# Patient Record
Sex: Female | Born: 1990 | Race: Black or African American | Hispanic: No | Marital: Single | Smoking: Current some day smoker
Health system: Southern US, Community
[De-identification: ages and names within clinical notes are randomized; demographics above are authoritative.]

## PROBLEM LIST (undated history)

## (undated) DIAGNOSIS — K219 Gastro-esophageal reflux disease without esophagitis: Secondary | ICD-10-CM

## (undated) DIAGNOSIS — J45909 Unspecified asthma, uncomplicated: Secondary | ICD-10-CM

## (undated) HISTORY — PX: MOUTH SURGERY: SHX715

---
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
## 2013-06-13 ENCOUNTER — Emergency Department (HOSPITAL_COMMUNITY)
Admission: EM | Admit: 2013-06-13 | Discharge: 2013-06-13 | Disposition: A | Discharge status: Home / Self Care | Payer: BC Managed Care – PPO | Source: Home / Self Care | Attending: Family Medicine | Admitting: Family Medicine

## 2013-06-13 ENCOUNTER — Encounter (HOSPITAL_COMMUNITY): Payer: Self-pay | Admitting: Emergency Medicine

## 2013-06-13 DIAGNOSIS — J45990 Exercise induced bronchospasm: Secondary | ICD-10-CM

## 2013-06-13 MED ORDER — TRIAMCINOLONE ACETONIDE 40 MG/ML IJ SUSP
40.0000 mg | Freq: Once | INTRAMUSCULAR | Status: AC
  Administered 2013-06-13: 40 mg via INTRAMUSCULAR

## 2013-06-13 MED ORDER — ALBUTEROL SULFATE HFA 108 (90 BASE) MCG/ACT IN AERS: 1.0000 | INHALATION_SPRAY | Freq: Four times a day (QID) | RESPIRATORY_TRACT | Status: DC | PRN

## 2013-06-13 MED ORDER — TRIAMCINOLONE ACETONIDE 40 MG/ML IJ SUSP
INTRAMUSCULAR | Status: AC
  Filled 2013-06-13: qty 1

## 2013-06-13 MED ORDER — MONTELUKAST SODIUM 10 MG PO TABS: 10.0000 mg | ORAL_TABLET | Freq: Every day | ORAL | Status: DC

## 2013-06-13 MED ORDER — METHYLPREDNISOLONE ACETATE 80 MG/ML IJ SUSP
INTRAMUSCULAR | Status: AC
  Filled 2013-06-13: qty 1

## 2013-06-13 MED ORDER — METHYLPREDNISOLONE ACETATE 40 MG/ML IJ SUSP
80.0000 mg | Freq: Once | INTRAMUSCULAR | Status: AC
  Administered 2013-06-13: 80 mg via INTRAMUSCULAR

## 2013-06-13 NOTE — ED Notes (Signed)
C/o 3 duration of ongoing cough; coughs until she throws up;  on amoxicillin from 2nd visit to her MD

## 2013-06-13 NOTE — ED Provider Notes (Signed)
CSN: [id number]     Arrival date & time 06/13/13  1842 History   First MD Initiated Contact with Patient 06/13/13 2005     Chief Complaint  Patient presents with  . Cough   (Consider location/radiation/quality/duration/timing/severity/associated sxs/prior Treatment) Patient is a 22 y.o. female presenting with cough. The history is provided by the patient.  Cough Cough characteristics:  Dry and hacking Severity:  Moderate Onset quality:  Gradual Duration:  3 weeks Timing:  Sporadic Progression:  Unchanged Chronicity:  New Smoker: no   Context: with activity   Ineffective treatments:  Decongestant Associated symptoms: shortness of breath   Associated symptoms: no fever and no wheezing     History reviewed. No pertinent past medical history. History reviewed. No pertinent past surgical history. History reviewed. No pertinent family history. History  Substance Use Topics  . Smoking status: Never Smoker   . Smokeless tobacco: Not on file  . Alcohol Use: No   OB History   Grav Para Term Preterm Abortions TAB SAB Ect Mult Living                 Review of Systems  Constitutional: Negative.  Negative for fever.  HENT: Negative.   Respiratory: Positive for cough and shortness of breath. Negative for wheezing.     Allergies  Review of patient's allergies indicates no known allergies.  Home Medications   Current Outpatient Rx  Name  Route  Sig  Dispense  Refill  . albuterol (PROVENTIL HFA;VENTOLIN HFA) 108 (90 BASE) MCG/ACT inhaler   Inhalation   Inhale 1-2 puffs into the lungs every 6 (six) hours as needed for wheezing or shortness of breath. Before exercise activity   1 Inhaler   1   . montelukast (SINGULAIR) 10 MG tablet   Oral   Take 1 tablet (10 mg total) by mouth at bedtime. For asthma prevention   30 tablet   1    BP 128/89  Pulse 68  Temp(Src) 99 F (37.2 C) (Oral)  Resp 16  SpO2 98%  LMP 06/13/2013 Physical Exam  Nursing note and vitals  reviewed. Constitutional: She is oriented to person, place, and time. She appears well-developed and well-nourished. No distress.  HENT:  Head: Normocephalic.  Right Ear: External ear normal.  Left Ear: External ear normal.  Mouth/Throat: Oropharynx is clear and moist.  Neck: Normal range of motion. Neck supple.  Cardiovascular: Regular rhythm.   Pulmonary/Chest: Effort normal and breath sounds normal. No respiratory distress. She has no wheezes.  Neurological: She is alert and oriented to person, place, and time.  Skin: Skin is warm and dry.    ED Course  Procedures (including critical care time) Labs Review Labs Reviewed - No data to display Imaging Review No results found.  EKG Interpretation    Date/Time:    Ventricular Rate:    PR Interval:    QRS Duration:   QT Interval:    QTC Calculation:   R Axis:     Text Interpretation:              MDM      Linna Hoff, MD 06/13/13 2117

## 2013-11-08 ENCOUNTER — Emergency Department (HOSPITAL_COMMUNITY)
Admission: EM | Admit: 2013-11-08 | Discharge: 2013-11-08 | Disposition: A | Discharge status: Home / Self Care | Payer: BC Managed Care – PPO | Source: Home / Self Care | Attending: Family Medicine | Admitting: Family Medicine

## 2013-11-08 ENCOUNTER — Encounter (HOSPITAL_COMMUNITY): Payer: Self-pay | Admitting: Emergency Medicine

## 2013-11-08 DIAGNOSIS — J302 Other seasonal allergic rhinitis: Secondary | ICD-10-CM

## 2013-11-08 DIAGNOSIS — J309 Allergic rhinitis, unspecified: Secondary | ICD-10-CM

## 2013-11-08 MED ORDER — FLUTICASONE PROPIONATE 50 MCG/ACT NA SUSP
1.0000 | Freq: Two times a day (BID) | NASAL | Status: DC
Start: 2013-11-08 — End: 2015-10-02

## 2013-11-08 MED ORDER — METHYLPREDNISOLONE ACETATE 40 MG/ML IJ SUSP
80.0000 mg | Freq: Once | INTRAMUSCULAR | Status: AC
  Administered 2013-11-08: 80 mg via INTRAMUSCULAR

## 2013-11-08 MED ORDER — HYDROCOD POLST-CHLORPHEN POLST 10-8 MG/5ML PO LQCR: 5.0000 mL | Freq: Two times a day (BID) | ORAL | Status: DC | PRN

## 2013-11-08 MED ORDER — METHYLPREDNISOLONE ACETATE 80 MG/ML IJ SUSP
INTRAMUSCULAR | Status: AC
  Filled 2013-11-08: qty 1

## 2013-11-08 MED ORDER — FEXOFENADINE HCL 180 MG PO TABS
180.0000 mg | ORAL_TABLET | Freq: Every day | ORAL | Status: DC
Start: 2013-11-08 — End: 2015-10-02

## 2013-11-08 NOTE — ED Provider Notes (Signed)
CSN: [id number]     Arrival date & time 11/08/13  0827 History   First MD Initiated Contact with Patient 11/08/13 0914     Chief Complaint  Patient presents with  . URI   (Consider location/radiation/quality/duration/timing/severity/associated sxs/prior Treatment) Patient is a 23 y.o. female presenting with URI. The history is provided by the patient.  URI Presenting symptoms: congestion, cough and rhinorrhea   Presenting symptoms: no fever   Severity:  Moderate Onset quality:  Gradual Duration:  1 week Chronicity:  New Associated symptoms: sneezing   Associated symptoms: no wheezing     History reviewed. No pertinent past medical history. History reviewed. No pertinent past surgical history. History reviewed. No pertinent family history. History  Substance Use Topics  . Smoking status: Never Smoker   . Smokeless tobacco: Not on file  . Alcohol Use: No   OB History   Grav Para Term Preterm Abortions TAB SAB Ect Mult Living                 Review of Systems  Constitutional: Negative.  Negative for fever.  HENT: Positive for congestion, postnasal drip, rhinorrhea and sneezing.   Respiratory: Positive for cough. Negative for wheezing.   Gastrointestinal: Positive for nausea.    Allergies  Review of patient's allergies indicates no known allergies.  Home Medications   Prior to Admission medications   Medication Sig Start Date End Date Taking? Authorizing Provider  albuterol (PROVENTIL HFA;VENTOLIN HFA) 108 (90 BASE) MCG/ACT inhaler Inhale 1-2 puffs into the lungs every 6 (six) hours as needed for wheezing or shortness of breath. Before exercise activity 06/13/13   Linna HoffJames D Tannah Dreyfuss, MD  montelukast (SINGULAIR) 10 MG tablet Take 1 tablet (10 mg total) by mouth at bedtime. For asthma prevention 06/13/13   Linna HoffJames D Chandrea Zellman, MD   BP 127/82  Pulse 71  Temp(Src) 97.7 F (36.5 C) (Oral)  Resp 14  SpO2 100%  LMP 10/19/2013 Physical Exam  Nursing note and vitals  reviewed. Constitutional: She is oriented to person, place, and time. She appears well-developed and well-nourished.  HENT:  Head: Normocephalic.  Right Ear: External ear normal.  Left Ear: External ear normal.  Nose: Mucosal edema and rhinorrhea present.  Mouth/Throat: Oropharynx is clear and moist.  Neck: Normal range of motion. Neck supple.  Pulmonary/Chest: Breath sounds normal.  Lymphadenopathy:    She has no cervical adenopathy.  Neurological: She is alert and oriented to person, place, and time.  Skin: Skin is warm.    ED Course  Procedures (including critical care time) Labs Review Labs Reviewed - No data to display  No results found for this or any previous visit. Imaging Review No results found.   MDM   1. Seasonal allergic rhinitis        Linna HoffJames D Innocence Schlotzhauer, MD 11/08/13 1006

## 2013-11-08 NOTE — ED Notes (Signed)
Pt  Reports   She   Has   Symptoms  Of  Nasal  Congested  Cough  And     Sinus  meds            X  sev  Days  Also  Reports            Stomach  Issues  To  Include       Vomiting  /  Constipation               For  sev  Weeks   She                Reports  A  History  Of       [place]

## 2015-10-02 ENCOUNTER — Inpatient Hospital Stay (HOSPITAL_COMMUNITY): Payer: BLUE CROSS/BLUE SHIELD

## 2015-10-02 ENCOUNTER — Encounter (HOSPITAL_COMMUNITY): Payer: Self-pay

## 2015-10-02 ENCOUNTER — Inpatient Hospital Stay (HOSPITAL_COMMUNITY)
Admission: AD | Admit: 2015-10-02 | Discharge: 2015-10-02 | Disposition: A | Discharge status: Home / Self Care | Payer: BLUE CROSS/BLUE SHIELD | Source: Ambulatory Visit | Attending: Family Medicine | Admitting: Family Medicine

## 2015-10-02 ENCOUNTER — Ambulatory Visit (INDEPENDENT_AMBULATORY_CARE_PROVIDER_SITE_OTHER): Payer: Self-pay | Admitting: General Practice

## 2015-10-02 DIAGNOSIS — O26899 Other specified pregnancy related conditions, unspecified trimester: Secondary | ICD-10-CM

## 2015-10-02 DIAGNOSIS — Z3A01 Less than 8 weeks gestation of pregnancy: Secondary | ICD-10-CM | POA: Insufficient documentation

## 2015-10-02 DIAGNOSIS — K219 Gastro-esophageal reflux disease without esophagitis: Secondary | ICD-10-CM | POA: Insufficient documentation

## 2015-10-02 DIAGNOSIS — Z3201 Encounter for pregnancy test, result positive: Secondary | ICD-10-CM

## 2015-10-02 DIAGNOSIS — O9989 Other specified diseases and conditions complicating pregnancy, childbirth and the puerperium: Secondary | ICD-10-CM | POA: Diagnosis not present

## 2015-10-02 DIAGNOSIS — O26891 Other specified pregnancy related conditions, first trimester: Secondary | ICD-10-CM | POA: Diagnosis not present

## 2015-10-02 DIAGNOSIS — R109 Unspecified abdominal pain: Secondary | ICD-10-CM | POA: Diagnosis not present

## 2015-10-02 DIAGNOSIS — J45909 Unspecified asthma, uncomplicated: Secondary | ICD-10-CM | POA: Diagnosis not present

## 2015-10-02 HISTORY — DX: Gastro-esophageal reflux disease without esophagitis: K21.9

## 2015-10-02 HISTORY — DX: Unspecified asthma, uncomplicated: J45.909

## 2015-10-02 LAB — CBC
HCT: 35.7 % — ABNORMAL LOW (ref 36.0–46.0)
Hemoglobin: 12.5 g/dL (ref 12.0–15.0)
MCH: 30.5 pg (ref 26.0–34.0)
MCHC: 35 g/dL (ref 30.0–36.0)
MCV: 87.1 fL (ref 78.0–100.0)
Platelets: 234 10*3/uL (ref 150–400)
RBC: 4.1 MIL/uL (ref 3.87–5.11)
RDW: 13.5 % (ref 11.5–15.5)
WBC: 5.7 10*3/uL (ref 4.0–10.5)

## 2015-10-02 LAB — WET PREP, GENITAL
Clue Cells Wet Prep HPF POC: NONE SEEN
Sperm: NONE SEEN
Trich, Wet Prep: NONE SEEN
Yeast Wet Prep HPF POC: NONE SEEN

## 2015-10-02 LAB — POCT PREGNANCY, URINE: Preg Test, Ur: POSITIVE — AB

## 2015-10-02 LAB — ABO/RH: ABO/RH(D): O POS

## 2015-10-02 LAB — HCG, QUANTITATIVE, PREGNANCY: hCG, Beta Chain, Quant, S: 169 m[IU]/mL — ABNORMAL HIGH (ref <5)

## 2015-10-02 NOTE — MAU Note (Signed)
Pt sent from clinic for evaluation. Pt had TAB in December and started having abdominal pain over the weekend. Pt had positive pregnancy test in clinic today.
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
## 2015-10-02 NOTE — Progress Notes (Signed)
Patient here for upt. upt +. Patient states she had an abortion 12/20. Patient denies intercourse since then. Patient reports normal period 09/01/15. Patient states she started to cramp on Saturday and pain has worsened since then. Spoke to The PNC FinancialLori Clemmons who recommends patient go to MAU for possible ectopic work up. Informed patient & escorted patient upstairs

## 2015-10-02 NOTE — Discharge Instructions (Signed)

## 2015-10-03 LAB — HIV ANTIBODY (ROUTINE TESTING W REFLEX): HIV Screen 4th Generation wRfx: NONREACTIVE

## 2015-10-03 LAB — GC/CHLAMYDIA PROBE AMP (~~LOC~~) NOT AT ARMC
Chlamydia: NEGATIVE
Neisseria Gonorrhea: NEGATIVE

## 2015-10-04 ENCOUNTER — Encounter: Payer: Self-pay | Admitting: Obstetrics & Gynecology

## 2015-10-04 ENCOUNTER — Encounter: Payer: Self-pay | Admitting: Family Medicine

## 2015-10-04 ENCOUNTER — Encounter: Payer: Self-pay | Admitting: *Deleted

## 2015-10-04 ENCOUNTER — Ambulatory Visit (INDEPENDENT_AMBULATORY_CARE_PROVIDER_SITE_OTHER): Payer: BLUE CROSS/BLUE SHIELD | Admitting: Obstetrics & Gynecology

## 2015-10-04 DIAGNOSIS — R109 Unspecified abdominal pain: Secondary | ICD-10-CM | POA: Diagnosis not present

## 2015-10-04 DIAGNOSIS — O9989 Other specified diseases and conditions complicating pregnancy, childbirth and the puerperium: Secondary | ICD-10-CM | POA: Diagnosis not present

## 2015-10-04 DIAGNOSIS — O3680X0 Pregnancy with inconclusive fetal viability, not applicable or unspecified: Secondary | ICD-10-CM

## 2015-10-04 LAB — HCG, QUANTITATIVE, PREGNANCY: hCG, Beta Chain, Quant, S: 373.6 m[IU]/mL — ABNORMAL HIGH

## 2015-10-04 NOTE — Patient Instructions (Signed)
Contraception Choices Contraception (birth control) is the use of any methods or devices to prevent pregnancy. Below are some methods to help avoid pregnancy. HORMONAL METHODS   Contraceptive implant. This is a thin, plastic tube containing progesterone hormone. It does not contain estrogen hormone. Your health care provider inserts the tube in the inner part of the upper arm. The tube can remain in place for up to 3 years. After 3 years, the implant must be removed. The implant prevents the ovaries from releasing an egg (ovulation), thickens the cervical mucus to prevent sperm from entering the uterus, and thins the lining of the inside of the uterus.  Progesterone-only injections. These injections are given every 3 months by your health care provider to prevent pregnancy. This synthetic progesterone hormone stops the ovaries from releasing eggs. It also thickens cervical mucus and changes the uterine lining. This makes it harder for sperm to survive in the uterus.  Birth control pills. These pills contain estrogen and progesterone hormone. They work by preventing the ovaries from releasing eggs (ovulation). They also cause the cervical mucus to thicken, preventing the sperm from entering the uterus. Birth control pills are prescribed by a health care provider.Birth control pills can also be used to treat heavy periods.  Minipill. This type of birth control pill contains only the progesterone hormone. They are taken every day of each month and must be prescribed by your health care provider.  Birth control patch. The patch contains hormones similar to those in birth control pills. It must be changed once a week and is prescribed by a health care provider.  Vaginal ring. The ring contains hormones similar to those in birth control pills. It is left in the vagina for 3 weeks, removed for 1 week, and then a new one is put back in place. The patient must be comfortable inserting and removing the ring  from the vagina.A health care provider's prescription is necessary.  Emergency contraception. Emergency contraceptives prevent pregnancy after unprotected sexual intercourse. This pill can be taken right after sex or up to 5 days after unprotected sex. It is most effective the sooner you take the pills after having sexual intercourse. Most emergency contraceptive pills are available without a prescription. Check with your pharmacist. Do not use emergency contraception as your only form of birth control. BARRIER METHODS   Female condom. This is a thin sheath (latex or rubber) that is worn over the penis during sexual intercourse. It can be used with spermicide to increase effectiveness.  Female condom. This is a soft, loose-fitting sheath that is put into the vagina before sexual intercourse.  Diaphragm. This is a soft, latex, dome-shaped barrier that must be fitted by a health care provider. It is inserted into the vagina, along with a spermicidal jelly. It is inserted before intercourse. The diaphragm should be left in the vagina for 6 to 8 hours after intercourse.  Cervical cap. This is a round, soft, latex or plastic cup that fits over the cervix and must be fitted by a health care provider. The cap can be left in place for up to 48 hours after intercourse.  Sponge. This is a soft, circular piece of polyurethane foam. The sponge has spermicide in it. It is inserted into the vagina after wetting it and before sexual intercourse.  Spermicides. These are chemicals that kill or block sperm from entering the cervix and uterus. They come in the form of creams, jellies, suppositories, foam, or tablets. They do not require a   prescription. They are inserted into the vagina with an applicator before having sexual intercourse. The process must be repeated every time you have sexual intercourse. INTRAUTERINE CONTRACEPTION  Intrauterine device (IUD). This is a T-shaped device that is put in a woman's uterus  during a menstrual period to prevent pregnancy. There are 2 types:  Copper IUD. This type of IUD is wrapped in copper wire and is placed inside the uterus. Copper makes the uterus and fallopian tubes produce a fluid that kills sperm. It can stay in place for 10 years.  Hormone IUD. This type of IUD contains the hormone progestin (synthetic progesterone). The hormone thickens the cervical mucus and prevents sperm from entering the uterus, and it also thins the uterine lining to prevent implantation of a fertilized egg. The hormone can weaken or kill the sperm that get into the uterus. It can stay in place for 3-5 years, depending on which type of IUD is used. PERMANENT METHODS OF CONTRACEPTION  Female tubal ligation. This is when the woman's fallopian tubes are surgically sealed, tied, or blocked to prevent the egg from traveling to the uterus.  Hysteroscopic sterilization. This involves placing a small coil or insert into each fallopian tube. Your doctor uses a technique called hysteroscopy to do the procedure. The device causes scar tissue to form. This results in permanent blockage of the fallopian tubes, so the sperm cannot fertilize the egg. It takes about 3 months after the procedure for the tubes to become blocked. You must use another form of birth control for these 3 months.  Female sterilization. This is when the female has the tubes that carry sperm tied off (vasectomy).This blocks sperm from entering the vagina during sexual intercourse. After the procedure, the man can still ejaculate fluid (semen). NATURAL PLANNING METHODS  Natural family planning. This is not having sexual intercourse or using a barrier method (condom, diaphragm, cervical cap) on days the woman could become pregnant.  Calendar method. This is keeping track of the length of each menstrual cycle and identifying when you are fertile.  Ovulation method. This is avoiding sexual intercourse during ovulation.  Symptothermal  method. This is avoiding sexual intercourse during ovulation, using a thermometer and ovulation symptoms.  Post-ovulation method. This is timing sexual intercourse after you have ovulated. Regardless of which type or method of contraception you choose, it is important that you use condoms to protect against the transmission of sexually transmitted infections (STIs). Talk with your health care provider about which form of contraception is most appropriate for you.   This information is not intended to replace advice given to you by your health care provider. Make sure you discuss any questions you have with your health care provider.   Document Released: 07/14/2005 Document Revised: 07/19/2013 Document Reviewed: 01/06/2013 [employer] Interactive Patient Education 2016 [employer]  
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
## 2015-10-18 ENCOUNTER — Ambulatory Visit (INDEPENDENT_AMBULATORY_CARE_PROVIDER_SITE_OTHER): Payer: BLUE CROSS/BLUE SHIELD | Admitting: Obstetrics & Gynecology

## 2015-10-18 ENCOUNTER — Other Ambulatory Visit: Payer: Self-pay

## 2015-10-18 ENCOUNTER — Encounter: Payer: Self-pay | Admitting: Obstetrics & Gynecology

## 2015-10-18 ENCOUNTER — Ambulatory Visit (HOSPITAL_COMMUNITY)
Admission: RE | Admit: 2015-10-18 | Discharge: 2015-10-18 | Disposition: A | Discharge status: Home / Self Care | Payer: BLUE CROSS/BLUE SHIELD | Source: Ambulatory Visit | Attending: Obstetrics and Gynecology | Admitting: Obstetrics and Gynecology

## 2015-10-18 DIAGNOSIS — Z3490 Encounter for supervision of normal pregnancy, unspecified, unspecified trimester: Secondary | ICD-10-CM | POA: Insufficient documentation

## 2015-10-18 DIAGNOSIS — O200 Threatened abortion: Secondary | ICD-10-CM | POA: Insufficient documentation

## 2015-10-18 DIAGNOSIS — Z3491 Encounter for supervision of normal pregnancy, unspecified, first trimester: Secondary | ICD-10-CM

## 2015-10-18 DIAGNOSIS — Z3A01 Less than 8 weeks gestation of pregnancy: Secondary | ICD-10-CM | POA: Diagnosis not present

## 2015-10-18 DIAGNOSIS — O0281 Inappropriate change in quantitative human chorionic gonadotropin (hCG) in early pregnancy: Secondary | ICD-10-CM

## 2015-10-18 HISTORY — DX: Encounter for supervision of normal pregnancy, unspecified, unspecified trimester: Z34.90

## 2015-10-18 NOTE — Progress Notes (Signed)
Called pt and advised her of next appt for US on 4/6 @ 0800. She will be seen @ the clinic after the US to receive results.  Pt voiced understanding.

## 2015-10-18 NOTE — Progress Notes (Signed)
Ultrasounds Results Note  SUBJECTIVE HPI:  Ms. Terry [name] is a 25 y.o. G2P0010 at 2065w5d by LMP who presents to the [facility] for followup ultrasound results. The patient denies abdominal pain or vaginal bleeding.  Upon review of the patient's records, patient was first seen in [facility] on 10/02/15 for cramping.  BHCG on that day was 169, follow up HCG was 373 2 days later.  Ultrasound showed nothing in uterus or adnexa.  Repeat ultrasound was performed earlier today.   Past Medical History  Diagnosis Date  . GERD (gastroesophageal reflux disease)   . Asthma     exercize induced   Past Surgical History  Procedure Laterality Date  . Mouth surgery     Social History   Social History  . Marital Status: Single    Spouse Name: N/A  . Number of Children: N/A  . Years of Education: N/A   Occupational History  . Not on file.   Social History Main Topics  . Smoking status: Never Smoker   . Smokeless tobacco: Never Used  . Alcohol Use: No  . Drug Use: No  . Sexual Activity: Not Currently    Birth Control/ Protection: None   Other Topics Concern  . Not on file   Social History Narrative   Current Outpatient Prescriptions on File Prior to Visit  Medication Sig Dispense Refill  . albuterol (PROVENTIL HFA;VENTOLIN HFA) 108 (90 BASE) MCG/ACT inhaler Inhale 1-2 puffs into the lungs every 6 (six) hours as needed for wheezing or shortness of breath. Before exercise activity 1 Inhaler 1  . gabapentin (NEURONTIN) 300 MG capsule Take 300 mg by mouth 2 (two) times daily.  11  . guaifenesin (ROBITUSSIN) 100 MG/5ML syrup Take 200 mg by mouth 3 (three) times daily as needed for cough.    . pantoprazole (PROTONIX) 40 MG tablet Take 1 tablet by mouth daily.  11  . Phenylephrine-DM-GG (SEVERE CONGESTION/COUGH MAX) 2.5-5-100 MG/5ML LIQD Take 5 mLs by mouth 2 (two) times daily as needed (for cough/congestion).     No current facility-administered medications on file prior to visit.   No  Known Allergies  I have reviewed patient's Past Medical Hx, Surgical Hx, Family Hx, Social Hx, medications and allergies.   Review of Systems Review of Systems  Constitutional: Negative for fever and chills.  Gastrointestinal: Negative for nausea, vomiting, abdominal pain, diarrhea and constipation.  Genitourinary: Negative for dysuria.  Musculoskeletal: Negative for back pain.  Neurological: Negative for dizziness and weakness.    Physical Exam  LMP 09/01/2015  GENERAL: Well-developed, well-nourished female in no acute distress.  HEENT: Normocephalic, atraumatic.   LUNGS: Effort normal ABDOMEN: soft, non-tender HEART: Regular rate  SKIN: Warm, dry and without erythema PSYCH: Normal mood and affect NEURO: Alert and oriented x 4  LAB RESULTS No results found for this or any previous visit (from the past 24 hour(s)).  IMAGING [place] Ob Comp Less 14 Wks  10/02/2015  CLINICAL DATA:  Cramping.  Positive urine pregnancy test. EXAM: OBSTETRIC <14 WK US AND TRANSVAGINAL OB US TECHNIQUE: Both transabdominal and transvaginal ultrasound examinations were performed for complete evaluation of the gestation as well as the maternal uterus, adnexal regions, and pelvic cul-de-sac. Transvaginal technique was performed to assess early pregnancy. COMPARISON:  None. FINDINGS: Intrauterine gestational sac: None Yolk sac:  No Embryo:  No Cardiac Activity: No Heart Rate: Not applicable  bpm Maternal uterus/adnexae: Subchorionic hemorrhage: None Right ovary: Not visualized Left ovary: Not visualized Other :None Free fluid:  Small amount of free fluid noted within the pelvis IMPRESSION: 1. No intrauterine gestational sac, yolk sac, or fetal pole identified. Differential considerations include intrauterine pregnancy too early to be sonographically visualized, missed abortion, or ectopic pregnancy. Followup ultrasound is recommended in 10-14 days for further evaluation. Electronically Signed   By: Signa Kell M.D.    On: 10/02/2015 15:50   US Ob Transvaginal  10/18/2015  CLINICAL DATA:  25 year old pregnant female presenting with threatened abortion. Non localization of the pregnancy on prior obstetric scan. EDC by LMP: 06/07/2016, projecting to an expected gestational age of [redacted] weeks 5 days. EXAM: TRANSVAGINAL OB ULTRASOUND TECHNIQUE: Transvaginal ultrasound was performed for complete evaluation of the gestation as well as the maternal uterus, adnexal regions, and pelvic cul-de-sac. COMPARISON:  10/02/2015 obstetric scan. FINDINGS: Intrauterine gestational sac: Single intrauterine gestational sac appears normal in size, shape and position. Yolk sac:  Present and normal. Embryo:  Present. Embryonic Cardiac Activity: Present. Heart Rate: 99 bpm MSD: 6.9  mm   5 w   3 d             [place] EDC: 06/16/2016 CRL:   1.9 mm    < 5 weeks Subchorionic hemorrhage:  None visualized. Maternal uterus/adnexae: Right ovary measures 2.8 x 1.9 x 2.3 cm. Left ovary measures 2.0 x 2.0 x 2.0 cm. No suspicious ovarian or adnexal masses. No abnormal free fluid in the pelvis. No uterine fibroids. IMPRESSION: 1. Single living intrauterine gestation measuring less than [redacted] weeks gestational age by crown-rump length, and measuring 5 weeks 3 days gestational age by mean sac diameter, which is discrepant with the expected gestational age of [redacted] weeks 5 days by provided menstrual dating. Borderline embryonic bradycardia. A follow-up obstetric scan is recommended in 4 weeks to assess fetal growth. 2. No ovarian or adnexal abnormality. Electronically Signed   By: Delbert Phenix M.D.   On: 10/18/2015 15:39   US Ob Transvaginal  10/02/2015  CLINICAL DATA:  Cramping.  Positive urine pregnancy test. EXAM: OBSTETRIC <14 WK [place] AND TRANSVAGINAL OB US TECHNIQUE: Both transabdominal and transvaginal ultrasound examinations were performed for complete evaluation of the gestation as well as the maternal uterus, adnexal regions, and pelvic cul-de-sac. Transvaginal technique was  performed to assess early pregnancy. COMPARISON:  None. FINDINGS: Intrauterine gestational sac: None Yolk sac:  No Embryo:  No Cardiac Activity: No Heart Rate: Not applicable  bpm Maternal uterus/adnexae: Subchorionic hemorrhage: None Right ovary: Not visualized Left ovary: Not visualized Other :None Free fluid:  Small amount of free fluid noted within the pelvis IMPRESSION: 1. No intrauterine gestational sac, yolk sac, or fetal pole identified. Differential considerations include intrauterine pregnancy too early to be sonographically visualized, missed abortion, or ectopic pregnancy. Followup ultrasound is recommended in 10-14 days for further evaluation. Electronically Signed   By: Signa Kell M.D.   On: 10/02/2015 15:50    ASSESSMENT 1.  IUP (intrauterine pregnancy) on prenatal ultrasound, first trimester     PLAN Given discrepancy between IUGS and CRL and bradycardia, repeat evaluation ordered in 2 weeks; will follow up results and manage accordingly. Increased risk of SAB. Patient is considering termination of pregnancy; discussed places she can go for this.  She may decide to do the ultrasound there and cancel the one here, she was told to call if she decides to do that. Patient advised to continue taking prenatal vitamins Return to clinic after ultrasound to discuss results.  Tereso Newcomer, MD  10/18/2015  3:58 PM

## 2015-10-18 NOTE — Progress Notes (Unsigned)
Patient complains of brown discharge when she wipes.  Also complains of pain in back and left lower side of abdomin.  Spoke with Dr. Macon LargeAnyanwu.  U/S ordered.

## 2015-10-18 NOTE — Patient Instructions (Signed)
Return to clinic for any scheduled appointments or obstetric concerns, or go to [facility] for evaluation  

## 2015-10-19 LAB — HCG, QUANTITATIVE, PREGNANCY: hCG, Beta Chain, Quant, S: 1885.9 m[IU]/mL — ABNORMAL HIGH

## 2015-11-01 ENCOUNTER — Ambulatory Visit (HOSPITAL_COMMUNITY): Payer: BLUE CROSS/BLUE SHIELD

## 2016-08-06 ENCOUNTER — Encounter (HOSPITAL_COMMUNITY): Payer: Self-pay

## 2016-12-29 IMAGING — US US OB TRANSVAGINAL
2 series · 15 of 28 positions shown · non-contrast
Comparison: 10/02/2015 obstetric scan.

CLINICAL DATA: 25-year-old pregnant female presenting with
threatened abortion. Non localization of the pregnancy on prior
obstetric scan.

EDC by LMP: 06/07/2016, projecting to an expected gestational age of
6 weeks 5 days.
EXAM:
TRANSVAGINAL OB ULTRASOUND
TECHNIQUE: Transvaginal ultrasound was performed for complete evaluation of the
gestation as well as the maternal uterus, adnexal regions, and
pelvic cul-de-sac.

[Series 1: us ob transvaginal · 43 acquisitions, 14 frames shown (1 of 2)]
[im 1/43]
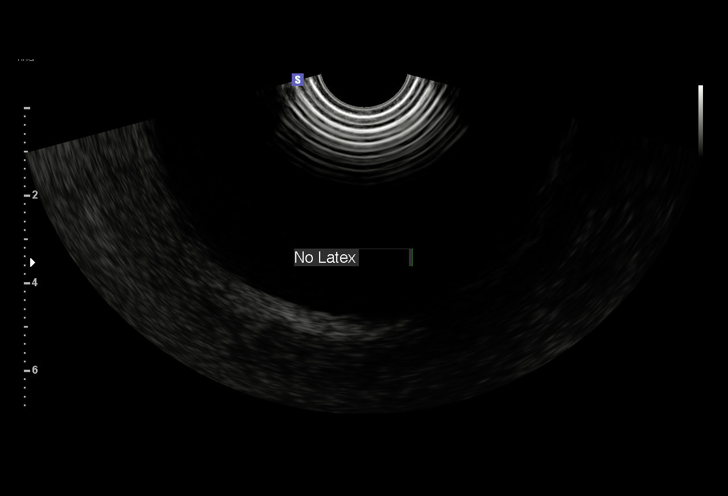
[im 4/43]
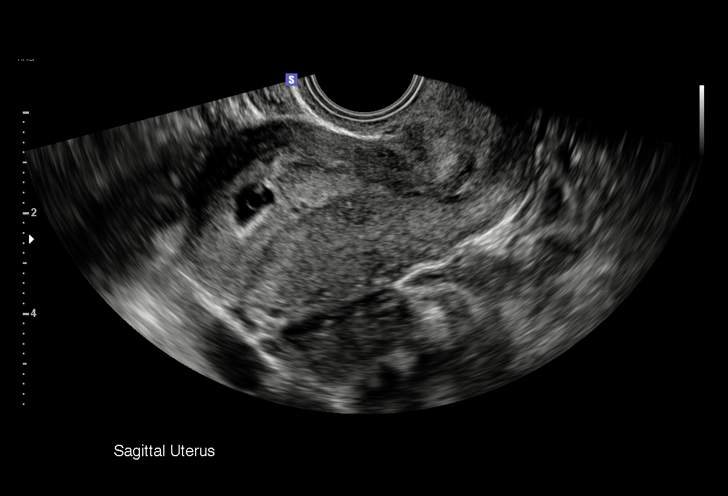
[im 7/43]
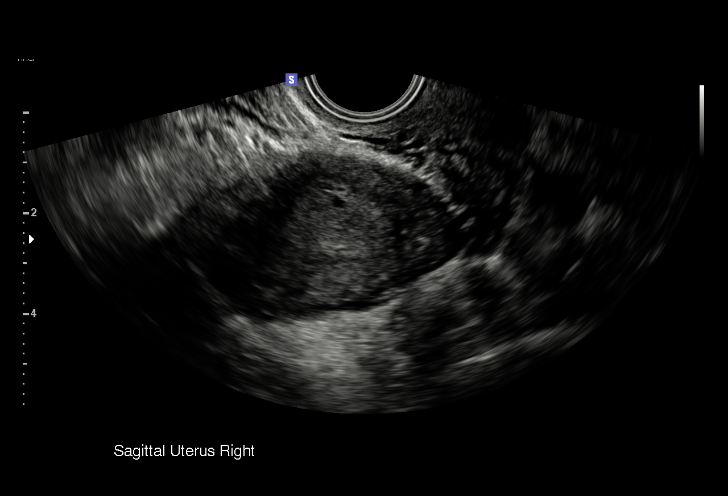
[im 10/43]
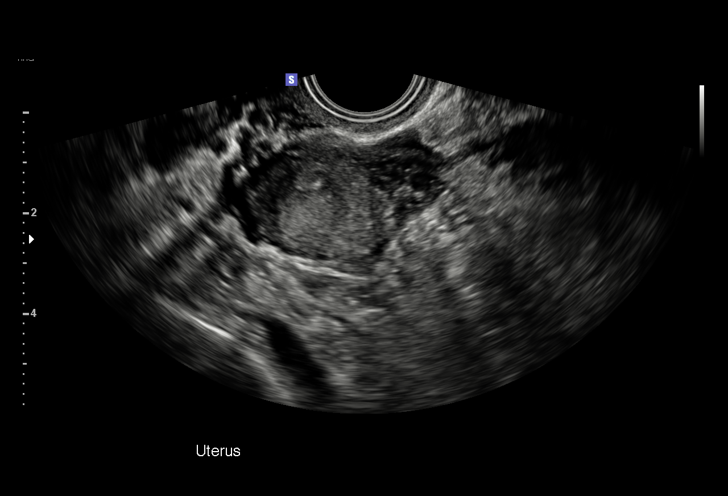
[im 13/43]
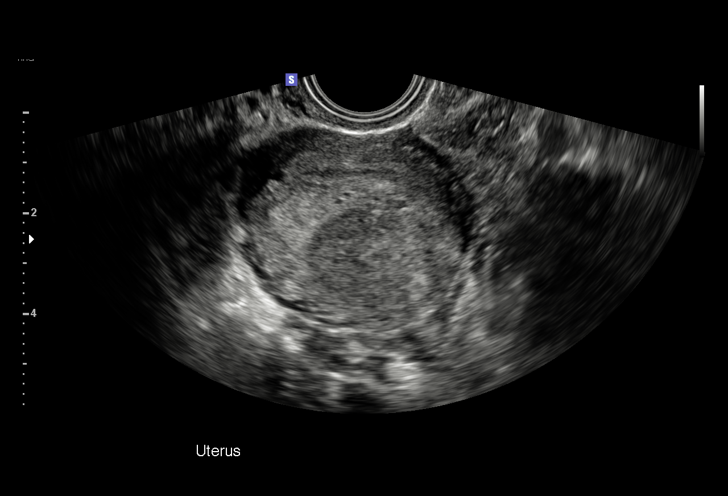
[im 17/43]
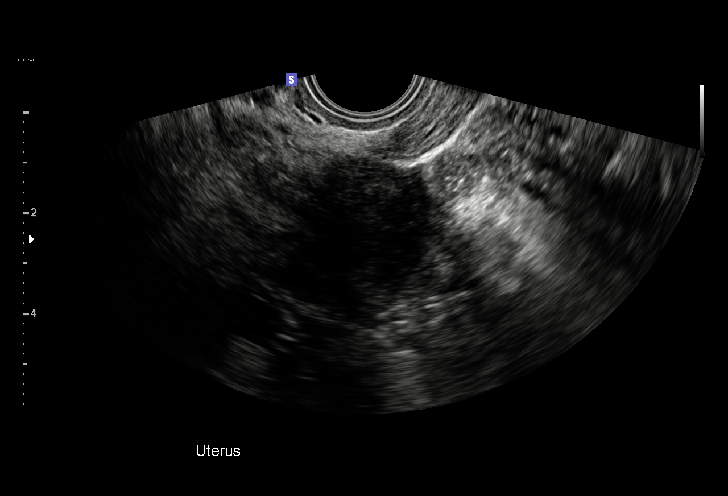
[im 20/43]
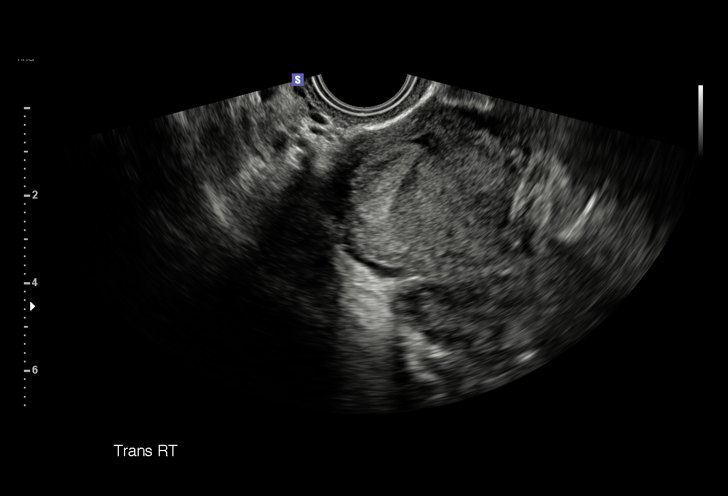
[im 23/43]
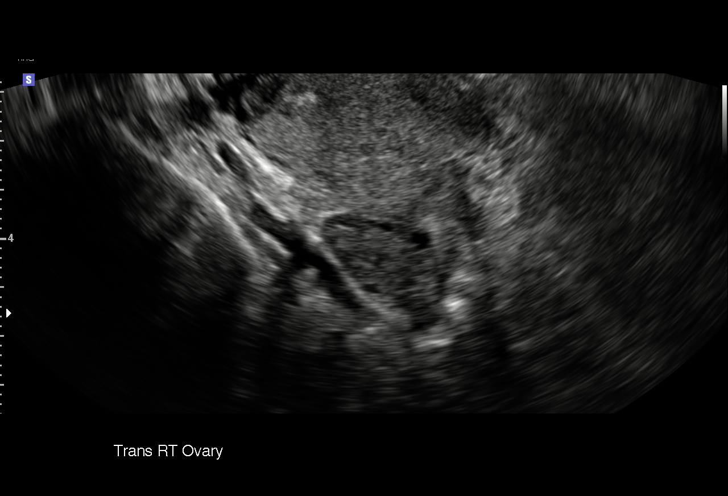
[im 25/43]
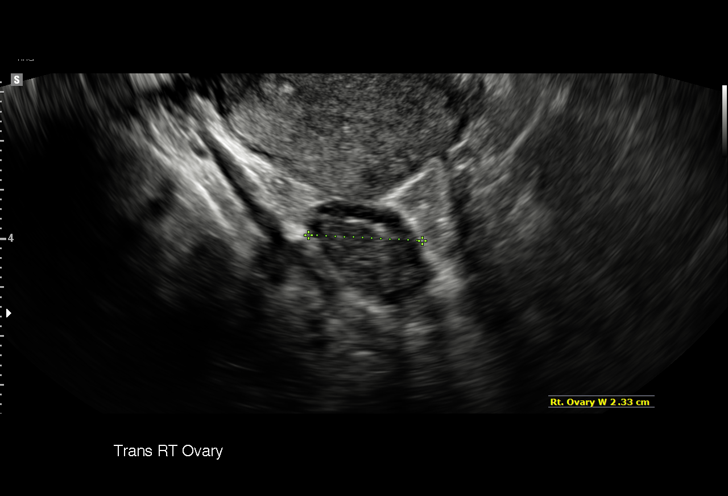
[im 28/43]
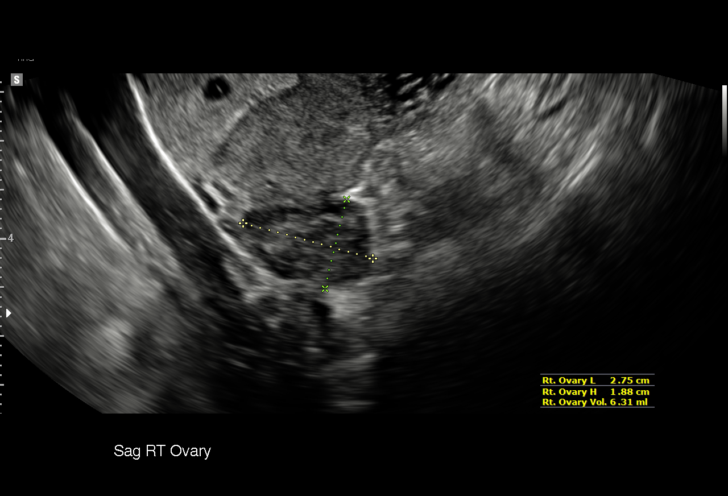
[im 31/43]
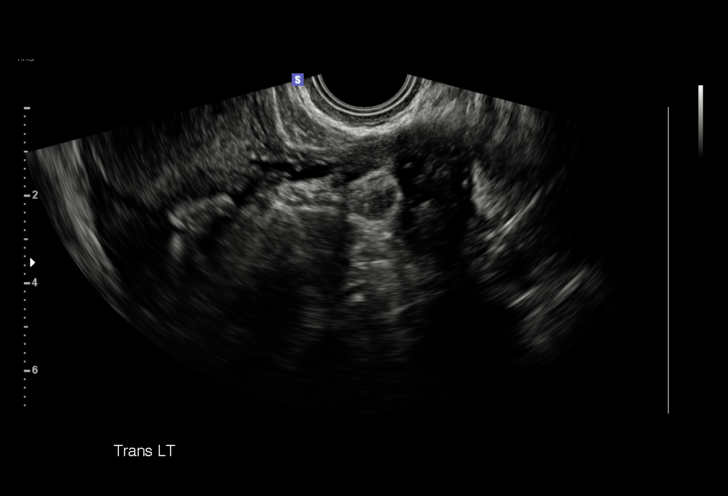
[im 34/43]
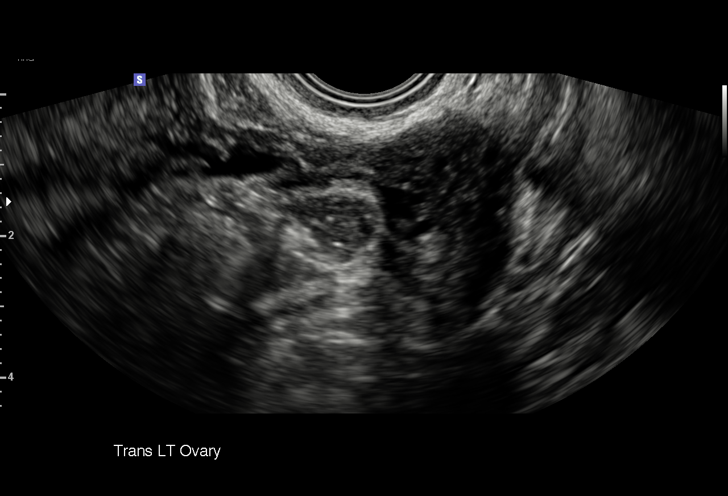
[im 38/43]
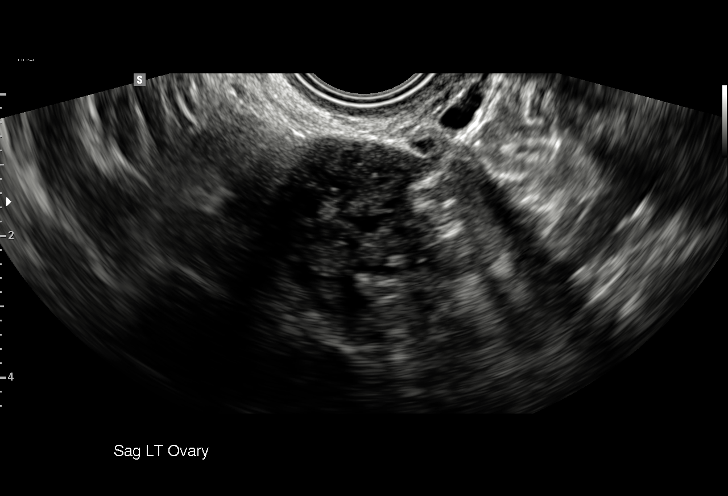
[im 41/43]
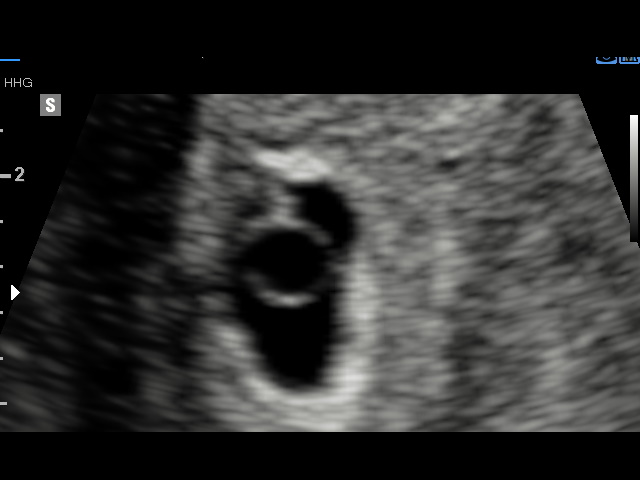

[Series 3: us ob transvaginal · 1 of 1 slices shown (2 of 2)]
[im 1/1]
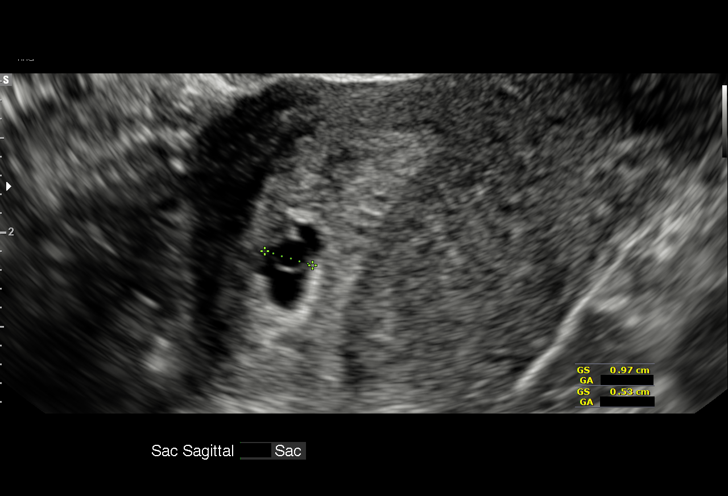

[15 of 28 positions shown; findings below may reference images not displayed]

FINDINGS: Intrauterine gestational sac: Single intrauterine gestational sac
appears normal in size, shape and position.

Yolk sac:  Present and normal.

Embryo:  Present.

Embryonic Cardiac Activity: Present.

Heart Rate: 99 bpm

MSD: 6.9  mm   5 w   3 d             US EDC: 06/16/2016

CRL:   1.9 mm    < 5 weeks

Subchorionic hemorrhage:  None visualized.

Maternal uterus/adnexae: Right ovary measures 2.8 x 1.9 x 2.3 cm.
Left ovary measures 2.0 x 2.0 x 2.0 cm. No suspicious ovarian or
adnexal masses. No abnormal free fluid in the pelvis. No uterine
fibroids.
IMPRESSION: 1. Single living intrauterine gestation measuring less than 5 weeks
gestational age by crown-rump length, and measuring 5 weeks 3 days
gestational age by mean sac diameter, which is discrepant with the
expected gestational age of 6 weeks 5 days by provided menstrual
dating. Borderline embryonic bradycardia. A follow-up obstetric scan
is recommended in 4 weeks to assess fetal growth.
2. No ovarian or adnexal abnormality.

## 2019-09-01 ENCOUNTER — Other Ambulatory Visit: Payer: Self-pay | Admitting: Nurse Practitioner

## 2020-02-04 ENCOUNTER — Ambulatory Visit: Admission: EM | Admit: 2020-02-04 | Discharge: 2020-02-04 | Discharge status: Against Medical Advice | Payer: Self-pay

## 2020-02-04 ENCOUNTER — Other Ambulatory Visit: Payer: Self-pay

## 2020-02-04 ENCOUNTER — Encounter (HOSPITAL_BASED_OUTPATIENT_CLINIC_OR_DEPARTMENT_OTHER): Payer: Self-pay | Admitting: Emergency Medicine

## 2020-02-04 ENCOUNTER — Ambulatory Visit: Payer: Self-pay

## 2020-02-04 ENCOUNTER — Emergency Department (HOSPITAL_BASED_OUTPATIENT_CLINIC_OR_DEPARTMENT_OTHER)
Admission: EM | Admit: 2020-02-04 | Discharge: 2020-02-04 | Disposition: A | Discharge status: Home / Self Care | Payer: Medicaid Other | Attending: Emergency Medicine | Admitting: Emergency Medicine

## 2020-02-04 DIAGNOSIS — S0990XA Unspecified injury of head, initial encounter: Secondary | ICD-10-CM | POA: Insufficient documentation

## 2020-02-04 DIAGNOSIS — M25512 Pain in left shoulder: Secondary | ICD-10-CM | POA: Insufficient documentation

## 2020-02-04 DIAGNOSIS — K219 Gastro-esophageal reflux disease without esophagitis: Secondary | ICD-10-CM | POA: Insufficient documentation

## 2020-02-04 DIAGNOSIS — M79641 Pain in right hand: Secondary | ICD-10-CM | POA: Insufficient documentation

## 2020-02-04 DIAGNOSIS — Y9389 Activity, other specified: Secondary | ICD-10-CM | POA: Insufficient documentation

## 2020-02-04 DIAGNOSIS — Z7951 Long term (current) use of inhaled steroids: Secondary | ICD-10-CM | POA: Insufficient documentation

## 2020-02-04 DIAGNOSIS — Y999 Unspecified external cause status: Secondary | ICD-10-CM | POA: Insufficient documentation

## 2020-02-04 DIAGNOSIS — M545 Low back pain: Secondary | ICD-10-CM | POA: Insufficient documentation

## 2020-02-04 DIAGNOSIS — R1030 Lower abdominal pain, unspecified: Secondary | ICD-10-CM | POA: Insufficient documentation

## 2020-02-04 DIAGNOSIS — M79642 Pain in left hand: Secondary | ICD-10-CM | POA: Insufficient documentation

## 2020-02-04 DIAGNOSIS — J45909 Unspecified asthma, uncomplicated: Secondary | ICD-10-CM | POA: Insufficient documentation

## 2020-02-04 DIAGNOSIS — Y92410 Unspecified street and highway as the place of occurrence of the external cause: Secondary | ICD-10-CM | POA: Insufficient documentation

## 2020-02-04 MED ORDER — DEXAMETHASONE 6 MG PO TABS
12.0000 mg | ORAL_TABLET | Freq: Once | ORAL | Status: AC
  Administered 2020-02-04: 12 mg via ORAL
  Filled 2020-02-04: qty 2
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
## 2020-03-06 ENCOUNTER — Other Ambulatory Visit: Payer: Self-pay

## 2020-03-06 ENCOUNTER — Ambulatory Visit (INDEPENDENT_AMBULATORY_CARE_PROVIDER_SITE_OTHER): Payer: Self-pay | Admitting: Family Medicine

## 2020-03-06 VITALS — BP 108/78 | Ht 63.5 in | Wt 210.0 lb

## 2020-03-06 DIAGNOSIS — M79641 Pain in right hand: Secondary | ICD-10-CM | POA: Insufficient documentation

## 2020-03-06 DIAGNOSIS — M25512 Pain in left shoulder: Secondary | ICD-10-CM

## 2020-03-06 DIAGNOSIS — M545 Low back pain, unspecified: Secondary | ICD-10-CM | POA: Insufficient documentation

## 2020-03-06 MED ORDER — CYCLOBENZAPRINE HCL 10 MG PO TABS
10.0000 mg | ORAL_TABLET | Freq: Every evening | ORAL | 0 refills | Status: DC | PRN
Start: 2020-03-06 — End: 2021-07-11

## 2020-03-06 MED ORDER — IBUPROFEN 600 MG PO TABS: 600.0000 mg | ORAL_TABLET | Freq: Three times a day (TID) | ORAL | 0 refills | Status: DC | PRN

## 2020-03-06 NOTE — Patient Instructions (Signed)
It was great to meet you today! Thank you for letting me participate in your care!  Today, we discussed your left shoulder pain which is being caused by some local inflammation and bursitis. Please take Ibuprofen as prescribed; take it scheduled for the next 4-5 days and then as needed. Please do the exercises we showed you at least 4-5 times per week. For your back the muscle relaxer will help you. Please take it at night time and do not drive afterward as it does make you very drowsy.  I will call you if the x-ray of your hand is abnormal.  Be well, Jules Schick, DO PGY-4, Sports Medicine Fellow [facility]
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
## 2020-03-06 NOTE — Assessment & Plan Note (Signed)
Patient history and clinical exam all indicate muscle spasms. Since she has no radiculopathy it is most likely just tight muscles of the lumbar spine and unlikely to have any nerve involvement. - Flexeril 10mg  QHS as needed - F/u in 6 weeks and if no improvement will consider imaging

## 2020-03-06 NOTE — Assessment & Plan Note (Signed)
Given clinical exam, history, and ultrasound findings her symptoms are most consistent with a mild bursitis of the supraspinatous tendon of the rotator cuff. - At home strengthening and stretching exercises - Ibuprofen 600mg  TID PRN for pain - F/u in 6 weeks, if no improvement consider steroid injection and formal PT

## 2020-03-07 ENCOUNTER — Encounter: Payer: Self-pay | Admitting: Family Medicine

## 2020-03-23 ENCOUNTER — Other Ambulatory Visit: Payer: Self-pay | Admitting: Family Medicine

## 2020-03-28 ENCOUNTER — Telehealth: Payer: Self-pay | Admitting: *Deleted

## 2020-03-28 NOTE — Telephone Encounter (Signed)
Terry [name] J. Lucretia Roers, RN, BSN, [place] [phone]  RNCM [facility] @0800 .  Spoke with pt at bedside and advised to please arrive 15 min early and take a picture ID and your current medications.  Pt verbalizes understanding of keeping appointment.

## 2020-04-18 ENCOUNTER — Ambulatory Visit: Payer: Medicaid Other | Admitting: Family Medicine

## 2020-04-19 ENCOUNTER — Encounter: Payer: Self-pay | Admitting: Nurse Practitioner

## 2020-04-19 ENCOUNTER — Ambulatory Visit (INDEPENDENT_AMBULATORY_CARE_PROVIDER_SITE_OTHER): Payer: Self-pay | Admitting: Nurse Practitioner

## 2020-04-19 ENCOUNTER — Other Ambulatory Visit: Payer: Self-pay

## 2020-04-19 VITALS — BP 133/76 | HR 70 | Temp 98.1°F | Ht 63.5 in | Wt 232.0 lb

## 2020-04-19 DIAGNOSIS — M545 Low back pain, unspecified: Secondary | ICD-10-CM

## 2020-04-19 DIAGNOSIS — F32A Depression, unspecified: Secondary | ICD-10-CM

## 2020-04-19 DIAGNOSIS — F329 Major depressive disorder, single episode, unspecified: Secondary | ICD-10-CM

## 2020-04-19 DIAGNOSIS — R42 Dizziness and giddiness: Secondary | ICD-10-CM
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
## 2020-04-19 NOTE — Patient Instructions (Addendum)
Back Exercises These exercises help to make your trunk and back strong. They also help to keep the lower back flexible. Doing these exercises can help to prevent back pain or lessen existing pain.  If you have back pain, try to do these exercises 2-3 times each day or as told by your doctor.  As you get better, do the exercises once each day. Repeat the exercises more often as told by your doctor.  To stop back pain from coming back, do the exercises once each day, or as told by your doctor. Exercises Single knee to chest Do these steps 3-5 times in a row for each leg: 1. Lie on your back on a firm bed or the floor with your legs stretched out. 2. Bring one knee to your chest. 3. Grab your knee or thigh with both hands and hold them it in place. 4. Pull on your knee until you feel a gentle stretch in your lower back or buttocks. 5. Keep doing the stretch for 10-30 seconds. 6. Slowly let go of your leg and straighten it. Pelvic tilt Do these steps 5-10 times in a row: 1. Lie on your back on a firm bed or the floor with your legs stretched out. 2. Bend your knees so they point up to the ceiling. Your feet should be flat on the floor. 3. Tighten your lower belly (abdomen) muscles to press your lower back against the floor. This will make your tailbone point up to the ceiling instead of pointing down to your feet or the floor. 4. Stay in this position for 5-10 seconds while you gently tighten your muscles and breathe evenly. Cat-cow Do these steps until your lower back bends more easily: 1. Get on your hands and knees on a firm surface. Keep your hands under your shoulders, and keep your knees under your hips. You may put padding under your knees. 2. Let your head hang down toward your chest. Tighten (contract) the muscles in your belly. Point your tailbone toward the floor so your lower back becomes rounded like the back of a cat. 3. Stay in this position for 5 seconds. 4. Slowly lift your  head. Let the muscles of your belly relax. Point your tailbone up toward the ceiling so your back forms a sagging arch like the back of a cow. 5. Stay in this position for 5 seconds.  Press-ups Do these steps 5-10 times in a row: 1. Lie on your belly (face-down) on the floor. 2. Place your hands near your head, about shoulder-width apart. 3. While you keep your back relaxed and keep your hips on the floor, slowly straighten your arms to raise the top half of your body and lift your shoulders. Do not use your back muscles. You may change where you place your hands in order to make yourself more comfortable. 4. Stay in this position for 5 seconds. 5. Slowly return to lying flat on the floor.  Bridges Do these steps 10 times in a row: 1. Lie on your back on a firm surface. 2. Bend your knees so they point up to the ceiling. Your feet should be flat on the floor. Your arms should be flat at your sides, next to your body. 3. Tighten your butt muscles and lift your butt off the floor until your waist is almost as high as your knees. If you do not feel the muscles working in your butt and the back of your thighs, slide your feet 1-2 inches   farther away from your butt. 4. Stay in this position for 3-5 seconds. 5. Slowly lower your butt to the floor, and let your butt muscles relax. If this exercise is too easy, try doing it with your arms crossed over your chest. Belly crunches Do these steps 5-10 times in a row: 1. Lie on your back on a firm bed or the floor with your legs stretched out. 2. Bend your knees so they point up to the ceiling. Your feet should be flat on the floor. 3. Cross your arms over your chest. 4. Tip your chin a little bit toward your chest but do not bend your neck. 5. Tighten your belly muscles and slowly raise your chest just enough to lift your shoulder blades a tiny bit off of the floor. Avoid raising your body higher than that, because it can put too much stress on your low  back. 6. Slowly lower your chest and your head to the floor. Back lifts Do these steps 5-10 times in a row: 1. Lie on your belly (face-down) with your arms at your sides, and rest your forehead on the floor. 2. Tighten the muscles in your legs and your butt. 3. Slowly lift your chest off of the floor while you keep your hips on the floor. Keep the back of your head in line with the curve in your back. Look at the floor while you do this. 4. Stay in this position for 3-5 seconds. 5. Slowly lower your chest and your face to the floor. Contact a doctor if:  Your back pain gets a lot worse when you do an exercise.  Your back pain does not get better 2 hours after you exercise. If you have any of these problems, stop doing the exercises. Do not do them again unless your doctor says it is okay. Get help right away if:  You have sudden, very bad back pain. If this happens, stop doing the exercises. Do not do them again unless your doctor says it is okay. This information is not intended to replace advice given to you by your health care provider. Make sure you discuss any questions you have with your health care provider. Document Revised: 04/08/2018 Document Reviewed: 04/08/2018 Elsevier Patient Education  2020 [employer] Inc[employer]    Benign Positional Vertigo Vertigo is the feeling that you or your surroundings are moving when they are not. Benign positional vertigo is the most common form of vertigo. This is usually a harmless condition (benign). This condition is positional. This means that symptoms are triggered by certain movements and positions. This condition can be dangerous if it occurs while you are doing something that could cause harm to you or others. This includes activities such as driving or operating machinery. What are the causes? In many cases, the cause of this condition is not known. It may be caused by a disturbance in an area of the inner ear that helps your brain to sense  movement and balance. This disturbance can be caused by:  Viral infection (labyrinthitis).  Head injury.  Repetitive motion, such as jumping, dancing, or running. What increases the risk? You are more likely to develop this condition if:  You are a woman.  You are 41 years of age or older. What are the signs or symptoms? Symptoms of this condition usually happen when you move your head or your eyes in different directions. Symptoms may start suddenly, and usually last for less than a minute. They include:  Loss  of balance and falling.  Feeling like you are spinning or moving.  Feeling like your surroundings are spinning or moving.  Nausea and vomiting.  Blurred vision.  Dizziness.  Involuntary eye movement (nystagmus). Symptoms can be mild and cause only minor problems, or they can be severe and interfere with daily life. Episodes of benign positional vertigo may return (recur) over time. Symptoms may improve over time. How is this diagnosed? This condition may be diagnosed based on:  Your medical history.  Physical exam of the head, neck, and ears.  Tests, such as: ? MRI. ? CT scan. ? Eye movement tests. Your health care provider may ask you to change positions quickly while he or she watches you for symptoms of benign positional vertigo, such as nystagmus. Eye movement may be tested with a variety of exams that are designed to evaluate or stimulate vertigo. ? An electroencephalogram (EEG). This records electrical activity in your brain. ? Hearing tests. You may be referred to a health care provider who specializes in ear, nose, and throat (ENT) problems (otolaryngologist) or a provider who specializes in disorders of the nervous system (neurologist). How is this treated?  This condition may be treated in a session in which your health care provider moves your head in specific positions to adjust your inner ear back to normal. Treatment for this condition may take  several sessions. Surgery may be needed in severe cases, but this is rare. In some cases, benign positional vertigo may resolve on its own in 2-4 weeks. Follow these instructions at home: Safety  Move slowly. Avoid sudden body or head movements or certain positions, as told by your health care provider.  Avoid driving until your health care provider says it is safe for you to do so.  Avoid operating heavy machinery until your health care provider says it is safe for you to do so.  Avoid doing any tasks that would be dangerous to you or others if vertigo occurs.  If you have trouble walking or keeping your balance, try using a cane for stability. If you feel dizzy or unstable, sit down right away.  Return to your normal activities as told by your health care provider. Ask your health care provider what activities are safe for you. General instructions  Take over-the-counter and prescription medicines only as told by your health care provider.  Drink enough fluid to keep your urine pale yellow.  Keep all follow-up visits as told by your health care provider. This is important. Contact a health care provider if:  You have a fever.  Your condition gets worse or you develop new symptoms.  Your family or friends notice any behavioral changes.  You have nausea or vomiting that gets worse.  You have numbness or a "pins and needles" sensation. Get help right away if you:  Have difficulty speaking or moving.  Are always dizzy.  Faint.  Develop severe headaches.  Have weakness in your legs or arms.  Have changes in your hearing or vision.  Develop a stiff neck.  Develop sensitivity to light. Summary  Vertigo is the feeling that you or your surroundings are moving when they are not. Benign positional vertigo is the most common form of vertigo.  The cause of this condition is not known. It may be caused by a disturbance in an area of the inner ear that helps your brain to  sense movement and balance.  Symptoms include loss of balance and falling, feeling that you or your  surroundings are moving, nausea and vomiting, and blurred vision.  This condition can be diagnosed based on symptoms, physical exam, and other tests, such as MRI, CT scan, eye movement tests, and hearing tests.  Follow safety instructions as told by your health care provider. You will also be told when to contact your health care provider in case of problems. This information is not intended to replace advice given to you by your health care provider. Make sure you discuss any questions you have with your health care provider. Document Revised: 12/23/2017 Document Reviewed: 12/23/2017 Elsevier Patient Education  2020 [employer]  How to Perform the Epley Maneuver The Epley maneuver is an exercise that relieves symptoms of vertigo. Vertigo is the feeling that you or your surroundings are moving when they are not. When you feel vertigo, you may feel like the room is spinning and have trouble walking. Dizziness is a little different than vertigo. When you are dizzy, you may feel unsteady or light-headed. You can do this maneuver at home whenever you have symptoms of vertigo. You can do it up to 3 times a day until your symptoms go away. Even though the Epley maneuver may relieve your vertigo for a few weeks, it is possible that your symptoms will return. This maneuver relieves vertigo, but it does not relieve dizziness. What are the risks? If it is done correctly, the Epley maneuver is considered safe. Sometimes it can lead to dizziness or nausea that goes away after a short time. If you develop other symptoms, such as changes in vision, weakness, or numbness, stop doing the maneuver and call your health care provider. How to perform the Epley maneuver 1. Sit on the edge of a bed or table with your back straight and your legs extended or hanging over the edge of the bed or table. 2. Turn your head  halfway toward the affected ear or side. 3. Lie backward quickly with your head turned until you are lying flat on your back. You may want to position a pillow under your shoulders. 4. Hold this position for 30 seconds. You may experience an attack of vertigo. This is normal. 5. Turn your head to the opposite direction until your unaffected ear is facing the floor. 6. Hold this position for 30 seconds. You may experience an attack of vertigo. This is normal. Hold this position until the vertigo stops. 7. Turn your whole body to the same side as your head. Hold for another 30 seconds. 8. Sit back up. You can repeat this exercise up to 3 times a day. Follow these instructions at home:  After doing the Epley maneuver, you can return to your normal activities.  Ask your health care provider if there is anything you should do at home to prevent vertigo. He or she may recommend that you: ? Keep your head raised (elevated) with two or more pillows while you sleep. ? Do not sleep on the side of your affected ear. ? Get up slowly from bed. ? Avoid sudden movements during the day. ? Avoid extreme head movement, like looking up or bending over. Contact a health care provider if:  Your vertigo gets worse.  You have other symptoms, including: ? Nausea. ? Vomiting. ? Headache. Get help right away if:  You have vision changes.  You have a severe or worsening headache or neck pain.  You cannot stop vomiting.  You have new numbness or weakness in any part of your body. Summary  Vertigo is  the feeling that you or your surroundings are moving when they are not.  The Epley maneuver is an exercise that relieves symptoms of vertigo.  If the Epley maneuver is done correctly, it is considered safe. You can do it up to 3 times a day. This information is not intended to replace advice given to you by your health care provider. Make sure you discuss any questions you have with your health care  provider. Document Revised: 06/26/2017 Document Reviewed: 06/03/2016 Elsevier Patient Education  2020 [employer].

## 2020-04-19 NOTE — Progress Notes (Signed)
[facility] Case Management Referral Note  04/19/2020 Name: Terry [name] MRN: [id number] DOB: May 13, 1991 Terry [name] is a 29 y.o. year old female who sees Patient, No Pcp Per for primary care. LCSW was consulted to assess patient's needs and assist the patient with Financial Difficulties related to no income and lack of health coverage.  Interpreter: No.   Interpreter Name & Language: none  Assessment: Patient experiencing Financial constraints related to no income and lack of health coverage Housing barriers.   CSW was consulted to speak to patient about Medicaid. Patient reported she applied in April but her case worker was changed and she has not yet received a determination. She plans to call today to reach the new caseworker. She applied for food stamps and received a food stamps card. She is not currently working; she received unemployment benefits but those are ending. She is currently looking for work. She is not sure how she will pay her rent after the next two months.   Intervention: Advised that patient will need to receive determination on Medicaid and if denied, will be eligible to apply for [employer] and CAFA. Provided and reviewed [place] card and CAFA applications. CSW will be available to assist in reaching DSS caseworker and will assist with [employer] and CAFA applications if Medicaid denied. Provided patient with CSW contact information.  Provided shelter and housing resources: Shelter list, [employer], Armed forces operational officer Aid for assistance in the case of possible eviction proceedings. Provided additional community resources including utility and food assistance and employment training.  Review of patient status, including review of consultants reports, relevant laboratory and other test results, and collaboration with appropriate care team members and the patient's provider was performed as part of comprehensive patient evaluation and provision of services.     SDOH (Social Determinants of Health) assessments performed: No  Informally assessed housing, transportation, and food access. Patient has a car. Patient has food stamps benefits. Patient is at risk of losing housing; provided resources detailed above.   SDOH Interventions     Most Recent Value  SDOH Interventions  Depression Interventions/Treatment  Referral to Psychiatry       Outpatient Encounter Medications as of 04/19/2020  Medication Sig  . cyclobenzaprine (FLEXERIL) 10 MG tablet Take 1 tablet (10 mg total) by mouth at bedtime as needed for muscle spasms.  Marland Kitchen KURVELO 0.15-30 MG-MCG tablet Take 1 tablet by mouth daily.   Marland Kitchen gabapentin (NEURONTIN) 300 MG capsule Take 300 mg by mouth 2 (two) times daily. (Patient not taking: Reported on 03/06/2020)  . [DISCONTINUED] albuterol (PROVENTIL HFA;VENTOLIN HFA) 108 (90 BASE) MCG/ACT inhaler Inhale 1-2 puffs into the lungs every 6 (six) hours as needed for wheezing or shortness of breath. Before exercise activity (Patient not taking: Reported on 03/06/2020)  . [DISCONTINUED] guaifenesin (ROBITUSSIN) 100 MG/5ML syrup Take 200 mg by mouth 3 (three) times daily as needed for cough. (Patient not taking: Reported on 03/06/2020)  . [DISCONTINUED] ibuprofen (ADVIL) 600 MG tablet TAKE 1 TABLET(600 MG) BY MOUTH EVERY 8 HOURS AS NEEDED (Patient not taking: Reported on 04/19/2020)  . [DISCONTINUED] pantoprazole (PROTONIX) 40 MG tablet Take 1 tablet by mouth daily. (Patient not taking: Reported on 03/06/2020)  . [DISCONTINUED] Phenylephrine-DM-GG (SEVERE CONGESTION/COUGH MAX) 2.5-5-100 MG/5ML LIQD Take 5 mLs by mouth 2 (two) times daily as needed (for cough/congestion). (Patient not taking: Reported on 03/06/2020)   No facility-administered encounter medications on file as of 04/19/2020.    Goals Addressed   None  Follow up Plan: 1. CSW available to assist with [employer] and CAFA applications 2. Available to assist in communicating with DSS  caseworker.  Abigail Butts, LCSW [facility] [phone]

## 2020-04-22 ENCOUNTER — Encounter: Payer: Self-pay | Admitting: Nurse Practitioner

## 2020-04-23 ENCOUNTER — Ambulatory Visit: Payer: Medicaid Other | Admitting: Family Medicine

## 2020-04-26 ENCOUNTER — Encounter: Payer: Self-pay | Admitting: Sports Medicine

## 2020-04-26 ENCOUNTER — Ambulatory Visit (INDEPENDENT_AMBULATORY_CARE_PROVIDER_SITE_OTHER): Payer: Self-pay | Admitting: Sports Medicine

## 2020-04-26 ENCOUNTER — Ambulatory Visit
Admission: RE | Admit: 2020-04-26 | Discharge: 2020-04-26 | Disposition: A | Discharge status: Home / Self Care | Payer: Medicaid Other | Source: Ambulatory Visit | Attending: Nurse Practitioner | Admitting: Nurse Practitioner

## 2020-04-26 ENCOUNTER — Ambulatory Visit
Admission: RE | Admit: 2020-04-26 | Discharge: 2020-04-26 | Disposition: A | Discharge status: Home / Self Care | Payer: Medicaid Other | Source: Ambulatory Visit | Attending: Family Medicine | Admitting: Family Medicine

## 2020-04-26 ENCOUNTER — Other Ambulatory Visit: Payer: Self-pay

## 2020-04-26 VITALS — BP 122/84 | Ht 63.5 in | Wt 232.0 lb

## 2020-04-26 DIAGNOSIS — M79641 Pain in right hand: Secondary | ICD-10-CM

## 2020-04-26 DIAGNOSIS — M545 Low back pain, unspecified: Secondary | ICD-10-CM

## 2020-04-26 DIAGNOSIS — M25512 Pain in left shoulder: Secondary | ICD-10-CM
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
## 2020-04-30 ENCOUNTER — Ambulatory Visit (INDEPENDENT_AMBULATORY_CARE_PROVIDER_SITE_OTHER): Payer: Self-pay | Admitting: Neurology

## 2020-04-30 ENCOUNTER — Encounter: Payer: Self-pay | Admitting: Neurology

## 2020-04-30 ENCOUNTER — Other Ambulatory Visit: Payer: Self-pay

## 2020-04-30 VITALS — BP 135/81 | HR 99 | Ht 63.5 in | Wt 234.0 lb

## 2020-04-30 DIAGNOSIS — R42 Dizziness and giddiness: Secondary | ICD-10-CM

## 2020-04-30 DIAGNOSIS — R519 Headache, unspecified: Secondary | ICD-10-CM

## 2020-04-30 DIAGNOSIS — G43709 Chronic migraine without aura, not intractable, without status migrainosus: Secondary | ICD-10-CM | POA: Insufficient documentation

## 2020-04-30 DIAGNOSIS — Z87828 Personal history of other (healed) physical injury and trauma: Secondary | ICD-10-CM

## 2020-04-30 DIAGNOSIS — H814 Vertigo of central origin: Secondary | ICD-10-CM

## 2020-04-30 MED ORDER — NORTRIPTYLINE HCL 25 MG PO CAPS: 50.0000 mg | ORAL_CAPSULE | Freq: Every day | ORAL | 11 refills | Status: DC

## 2020-04-30 MED ORDER — SUMATRIPTAN SUCCINATE 25 MG PO TABS: 25.0000 mg | ORAL_TABLET | ORAL | 11 refills | Status: DC | PRN

## 2020-04-30 MED ORDER — ONDANSETRON 4 MG PO TBDP: 4.0000 mg | ORAL_TABLET | Freq: Three times a day (TID) | ORAL | 6 refills | Status: DC | PRN

## 2020-04-30 NOTE — Patient Instructions (Signed)
Nortriptyline 25mg  every night xone week, then 2 tablets every night  Imitrex 25mg  as needed. Alevel 1-2 tablets as needed zofran 4mg  as needed for nause
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
## 2020-05-01 ENCOUNTER — Telehealth: Payer: Self-pay | Admitting: Neurology

## 2020-05-01 NOTE — Telephone Encounter (Signed)
self pay order sent to GI. They will reach out to the patient to schedule.  °

## 2020-05-04 ENCOUNTER — Other Ambulatory Visit: Payer: Self-pay

## 2020-05-04 ENCOUNTER — Ambulatory Visit
Admission: RE | Admit: 2020-05-04 | Discharge: 2020-05-04 | Disposition: A | Discharge status: Home / Self Care | Payer: Medicaid Other | Source: Ambulatory Visit | Attending: Neurology | Admitting: Neurology

## 2020-05-04 DIAGNOSIS — H814 Vertigo of central origin: Secondary | ICD-10-CM

## 2020-05-07 ENCOUNTER — Telehealth: Payer: Self-pay | Admitting: Neurology

## 2020-05-07 NOTE — Telephone Encounter (Signed)
IMPRESSION: Unremarkable MRI scan of the brain without contrast.  Abnormal signal in the left mastoid and petrous apex region likely from chronic inflammatory changes is noted .  Please call patient, MRI of the brain showed no acute abnormality, there was abnormal signal in the left mastoid, and petrous apex region, likely from chronic inflammatory changes,  Please asked patient if she has any left ear symptoms, left behind the ear pain, if she does, she needs to see the ENT physician

## 2020-05-07 NOTE — Telephone Encounter (Signed)
I have spoken with the patient and provided her with her MRI brain results. She denies any pain or pressure around her left ear. She will continue taking the prescribed medication and keep her follow up here on 08/06/2020.

## 2020-06-07 ENCOUNTER — Ambulatory Visit: Payer: Medicaid Other | Admitting: Sports Medicine

## 2020-06-14 ENCOUNTER — Other Ambulatory Visit: Payer: Self-pay

## 2020-06-14 ENCOUNTER — Ambulatory Visit (INDEPENDENT_AMBULATORY_CARE_PROVIDER_SITE_OTHER): Payer: Self-pay | Admitting: Sports Medicine

## 2020-06-14 VITALS — BP 124/90 | Ht 63.5 in | Wt 235.0 lb

## 2020-06-14 DIAGNOSIS — M25512 Pain in left shoulder: Secondary | ICD-10-CM

## 2020-06-14 DIAGNOSIS — M545 Low back pain, unspecified: Secondary | ICD-10-CM

## 2020-06-14 DIAGNOSIS — G8929 Other chronic pain: Secondary | ICD-10-CM

## 2020-06-14 NOTE — Patient Instructions (Signed)
It was great to see you today! Our plan for today: -We are ordering an MRI of your left shoulder.  We will call you once this is been completed and we will discuss next steps. -For your back pain I expect that this will continue to slowly get better, these injuries can take an extended period of time to resolve.  I would like for you to follow-up you start developing any pains shooting down your legs.
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
## 2020-07-06 ENCOUNTER — Ambulatory Visit
Admission: RE | Admit: 2020-07-06 | Discharge: 2020-07-06 | Disposition: A | Discharge status: Home / Self Care | Payer: Self-pay | Source: Ambulatory Visit | Attending: Sports Medicine | Admitting: Sports Medicine

## 2020-07-06 DIAGNOSIS — M25512 Pain in left shoulder: Secondary | ICD-10-CM

## 2020-07-09 ENCOUNTER — Other Ambulatory Visit: Payer: Self-pay

## 2020-07-09 MED ORDER — DIAZEPAM 10 MG PO TABS: ORAL_TABLET | ORAL | 0 refills | Status: DC

## 2020-07-09 NOTE — Progress Notes (Signed)
Pt  was unable to complete MRI. She has another appt but is asking for valium to be sent to pharmacy for her upcoming MRI.

## 2020-07-10 ENCOUNTER — Ambulatory Visit
Admission: RE | Admit: 2020-07-10 | Discharge: 2020-07-10 | Disposition: A | Discharge status: Home / Self Care | Payer: Medicaid Other | Source: Ambulatory Visit | Attending: Sports Medicine | Admitting: Sports Medicine

## 2020-07-10 ENCOUNTER — Other Ambulatory Visit: Payer: Self-pay

## 2020-07-17 ENCOUNTER — Telehealth: Payer: Self-pay | Admitting: Sports Medicine

## 2020-07-17 NOTE — Telephone Encounter (Signed)
  Patient notified via telephone yesterday that the MRI of her left shoulder is normal.  We have recommended physical therapy.  She would like to think about this.  Follow-up as needed.

## 2020-08-02 ENCOUNTER — Other Ambulatory Visit: Payer: Self-pay

## 2020-08-02 DIAGNOSIS — M25512 Pain in left shoulder: Secondary | ICD-10-CM

## 2020-08-02 DIAGNOSIS — G8929 Other chronic pain: Secondary | ICD-10-CM
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
## 2020-08-06 ENCOUNTER — Encounter: Payer: Self-pay | Admitting: Neurology

## 2020-08-06 ENCOUNTER — Ambulatory Visit: Payer: Self-pay | Admitting: Neurology
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
## 2020-08-09 ENCOUNTER — Telehealth: Payer: Self-pay | Admitting: Neurology

## 2020-08-09 NOTE — Telephone Encounter (Signed)
Pt called, would like to be sooner than April. SUMAtriptan (IMITREX) 25 MG tablet and nortriptyline (PAMELOR) 25 MG capsule is not working. Would would like a call from the nurse.

## 2020-08-09 NOTE — Telephone Encounter (Signed)
The patient's appt has been moved to 08/20/20 with Dr. Terrace Arabia. Maralyn Sago did not have any earlier appts.

## 2020-08-20 ENCOUNTER — Ambulatory Visit (INDEPENDENT_AMBULATORY_CARE_PROVIDER_SITE_OTHER): Payer: Self-pay | Admitting: Neurology

## 2020-08-20 ENCOUNTER — Encounter: Payer: Self-pay | Admitting: Neurology

## 2020-08-20 VITALS — BP 140/81 | HR 96 | Ht 63.5 in | Wt 243.0 lb

## 2020-08-20 DIAGNOSIS — Z87828 Personal history of other (healed) physical injury and trauma: Secondary | ICD-10-CM

## 2020-08-20 DIAGNOSIS — R519 Headache, unspecified: Secondary | ICD-10-CM

## 2020-08-20 MED ORDER — VENLAFAXINE HCL ER 37.5 MG PO CP24: 37.5000 mg | ORAL_CAPSULE | Freq: Every day | ORAL | 11 refills | Status: DC

## 2020-08-20 MED ORDER — RIZATRIPTAN BENZOATE 10 MG PO TBDP: 10.0000 mg | ORAL_TABLET | ORAL | 6 refills | Status: DC | PRN
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
## 2020-08-22 ENCOUNTER — Telehealth: Payer: Self-pay | Admitting: Neurology

## 2020-08-22 ENCOUNTER — Ambulatory Visit: Payer: No Typology Code available for payment source | Attending: Sports Medicine | Admitting: Physical Therapy

## 2020-08-22 MED ORDER — RIZATRIPTAN BENZOATE 10 MG PO TBDP: ORAL_TABLET | ORAL | 6 refills | Status: DC

## 2020-08-22 MED ORDER — VENLAFAXINE HCL ER 37.5 MG PO CP24: 37.5000 mg | ORAL_CAPSULE | Freq: Every day | ORAL | 11 refills | Status: DC

## 2020-08-22 NOTE — Telephone Encounter (Signed)
Pt request refills venlafaxine XR (EFFEXOR XR) 37.5 MG 24 hr capsule and rizatriptan (MAXALT-MLT) 10 MG disintegrating tablet at [id number].

## 2020-08-22 NOTE — Telephone Encounter (Signed)
I called [facility] who confirmed neither prescription was received. They have both been resent. The patient is aware.

## 2020-08-31 ENCOUNTER — Ambulatory Visit: Payer: Self-pay | Attending: Sports Medicine | Admitting: Physical Therapy

## 2020-08-31 ENCOUNTER — Other Ambulatory Visit: Payer: Self-pay

## 2020-08-31 ENCOUNTER — Encounter: Payer: Self-pay | Admitting: Physical Therapy

## 2020-08-31 DIAGNOSIS — M6281 Muscle weakness (generalized): Secondary | ICD-10-CM | POA: Insufficient documentation

## 2020-08-31 DIAGNOSIS — M25512 Pain in left shoulder: Secondary | ICD-10-CM | POA: Insufficient documentation

## 2020-08-31 DIAGNOSIS — M545 Low back pain, unspecified: Secondary | ICD-10-CM | POA: Insufficient documentation

## 2020-08-31 DIAGNOSIS — G8929 Other chronic pain: Secondary | ICD-10-CM | POA: Insufficient documentation
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
## 2020-09-13 ENCOUNTER — Ambulatory Visit: Payer: Self-pay

## 2020-09-19 ENCOUNTER — Ambulatory Visit: Payer: Self-pay | Admitting: Physical Therapy

## 2020-09-25 ENCOUNTER — Encounter: Payer: Medicaid Other | Admitting: Family Medicine

## 2020-09-25 ENCOUNTER — Ambulatory Visit: Payer: Self-pay | Attending: Sports Medicine | Admitting: Physical Therapy

## 2020-09-25 ENCOUNTER — Encounter: Payer: Self-pay | Admitting: Physical Therapy

## 2020-09-25 ENCOUNTER — Telehealth: Payer: Self-pay | Admitting: Physical Therapy

## 2020-09-25 DIAGNOSIS — G8929 Other chronic pain: Secondary | ICD-10-CM | POA: Insufficient documentation

## 2020-09-25 DIAGNOSIS — M545 Low back pain, unspecified: Secondary | ICD-10-CM | POA: Insufficient documentation

## 2020-09-25 DIAGNOSIS — M6281 Muscle weakness (generalized): Secondary | ICD-10-CM | POA: Insufficient documentation

## 2020-09-25 DIAGNOSIS — M25512 Pain in left shoulder: Secondary | ICD-10-CM | POA: Insufficient documentation

## 2020-09-25 NOTE — Telephone Encounter (Signed)
Patient was a no show for her appt today.  Called to remind her of her next appt this week 3/3 and also of the cancellation, no show policy.  Left voicemail and number to call if needed.  Karie Mainland, PT 09/25/20 8:45 AM Phone: [phone] Fax: [phone]

## 2020-09-27 ENCOUNTER — Telehealth: Payer: Self-pay

## 2020-09-27 ENCOUNTER — Ambulatory Visit: Payer: Self-pay

## 2020-09-27 NOTE — Telephone Encounter (Signed)
Called and left voicemail regarding patient's no show this morning and reiterated attendance policy. Confirmed patient's next appointment and informed her that all other appointments will be cancelled at this time due to attendance policy, but she will be able to schedule one appointment at a time. Informed patient that she will be discharged and require new PT referral to return if she does not show for her next appointment. Requested that she call if she needs to cancel and provided clinic phone number.  Rhea Bleacher, PT, DPT 09/27/20 10:24 AM

## 2020-10-02 ENCOUNTER — Ambulatory Visit: Payer: Self-pay | Admitting: Physical Therapy

## 2020-10-04 ENCOUNTER — Encounter: Payer: Medicaid Other | Admitting: Physical Therapy

## 2020-10-05 ENCOUNTER — Ambulatory Visit: Payer: Self-pay | Admitting: Physical Therapy

## 2020-10-09 ENCOUNTER — Encounter: Payer: Medicaid Other | Admitting: Physical Therapy

## 2020-10-11 ENCOUNTER — Ambulatory Visit: Payer: Self-pay | Admitting: Physical Therapy

## 2020-10-11 ENCOUNTER — Other Ambulatory Visit: Payer: Self-pay

## 2020-10-11 ENCOUNTER — Encounter: Payer: Medicaid Other | Admitting: Physical Therapy

## 2020-10-11 ENCOUNTER — Encounter: Payer: Self-pay | Admitting: Physical Therapy

## 2020-10-11 DIAGNOSIS — M545 Low back pain, unspecified: Secondary | ICD-10-CM

## 2020-10-11 DIAGNOSIS — M25512 Pain in left shoulder: Secondary | ICD-10-CM

## 2020-10-11 DIAGNOSIS — M6281 Muscle weakness (generalized): Secondary | ICD-10-CM

## 2020-10-11 DIAGNOSIS — G8929 Other chronic pain: Secondary | ICD-10-CM
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
## 2020-10-16 ENCOUNTER — Encounter: Payer: Medicaid Other | Admitting: Physical Therapy

## 2020-10-18 ENCOUNTER — Encounter: Payer: Medicaid Other | Admitting: Physical Therapy

## 2020-10-24 ENCOUNTER — Other Ambulatory Visit: Payer: Self-pay

## 2020-10-24 ENCOUNTER — Ambulatory Visit: Payer: Medicaid Other | Admitting: Physical Therapy

## 2020-10-24 ENCOUNTER — Encounter: Payer: Self-pay | Admitting: Physical Therapy

## 2020-10-24 DIAGNOSIS — G8929 Other chronic pain: Secondary | ICD-10-CM

## 2020-10-24 DIAGNOSIS — M6281 Muscle weakness (generalized): Secondary | ICD-10-CM

## 2020-10-24 DIAGNOSIS — M545 Low back pain, unspecified: Secondary | ICD-10-CM

## 2020-10-24 NOTE — Patient Instructions (Signed)
Access Code: [email or web address] URL: [email or web address] Date: 10/24/2020 Prepared by: Jeri Cos  Exercises  Supine Lower Trunk Rotation - 2 x daily - 7 x weekly - 1 sets - 10 reps - 10 hold Knee to chest stretch - 2 x daily - 7 x weekly - 1 sets - 3 reps - 15 second hold Modified Thomas Stretch - 2 x daily - 7 x weekly - 1 sets - 3 reps - 30 hold Pelvic tilt - 2 x daily - 7 x weekly - 2 sets - 10 reps - 3-5 seconds hold Supine Shoulder Horizontal Abduction with Resistance - 2 x daily - 7 x weekly - 2 sets - 10 reps Supine Hamstring Stretch with Strap - 1 x daily - 7 x weekly - 3 sets - 10 reps - 15 seconds hold Doorway Pec Stretch at 90 Degrees Abduction - 1 x daily - 7 x weekly - 3 sets - 10-15 seconds hold Seated Scapular Retraction - 1 x daily - 7 x weekly - 2 sets - 10 reps
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
## 2020-11-06 ENCOUNTER — Other Ambulatory Visit: Payer: Self-pay

## 2020-11-06 ENCOUNTER — Encounter: Payer: Self-pay | Admitting: Physical Therapy

## 2020-11-06 ENCOUNTER — Ambulatory Visit: Payer: Self-pay | Attending: Sports Medicine | Admitting: Physical Therapy

## 2020-11-06 DIAGNOSIS — M25512 Pain in left shoulder: Secondary | ICD-10-CM | POA: Insufficient documentation

## 2020-11-06 DIAGNOSIS — G8929 Other chronic pain: Secondary | ICD-10-CM | POA: Insufficient documentation

## 2020-11-06 DIAGNOSIS — M6281 Muscle weakness (generalized): Secondary | ICD-10-CM | POA: Insufficient documentation

## 2020-11-06 DIAGNOSIS — M545 Low back pain, unspecified: Secondary | ICD-10-CM | POA: Insufficient documentation
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
## 2020-11-06 NOTE — Patient Instructions (Signed)
Access Code: [email or web address] URL: [email or web address] Date: 11/06/2020 Prepared by: Jannette Spanner  Exercises Supine Lower Trunk Rotation - 2 x daily - 7 x weekly - 1 sets - 10 reps - 10 hold Knee to chest stretch - 2 x daily - 7 x weekly - 1 sets - 3 reps - 15 second hold Modified Thomas Stretch - 2 x daily - 7 x weekly - 1 sets - 3 reps - 30 hold Pelvic tilt - 2 x daily - 7 x weekly - 2 sets - 10 reps - 3-5 seconds hold Supine Shoulder Horizontal Abduction with Resistance - 2 x daily - 7 x weekly - 2 sets - 10 reps Supine Hamstring Stretch with Strap - 1 x daily - 7 x weekly - 3 sets - 10 reps - 15 seconds hold Doorway Pec Stretch at 90 Degrees Abduction - 1 x daily - 7 x weekly - 3 sets - 10-15 seconds hold Seated Scapular Retraction - 1 x daily - 7 x weekly - 2 sets - 10 reps Supine Bridge - 2 x daily - 7 x weekly - 2 sets - 10 reps Supine March - 2 x daily - 7 x weekly - 2 sets - 10 reps

## 2020-11-22 ENCOUNTER — Ambulatory Visit: Payer: Self-pay | Admitting: Physical Therapy

## 2020-11-28 ENCOUNTER — Other Ambulatory Visit: Payer: Self-pay

## 2020-11-28 ENCOUNTER — Ambulatory Visit: Payer: Self-pay | Attending: Sports Medicine | Admitting: Physical Therapy

## 2020-11-28 DIAGNOSIS — M25512 Pain in left shoulder: Secondary | ICD-10-CM | POA: Insufficient documentation

## 2020-11-28 DIAGNOSIS — M545 Low back pain, unspecified: Secondary | ICD-10-CM

## 2020-11-28 DIAGNOSIS — M6281 Muscle weakness (generalized): Secondary | ICD-10-CM

## 2020-11-28 DIAGNOSIS — G8929 Other chronic pain: Secondary | ICD-10-CM
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
## 2020-12-04 ENCOUNTER — Other Ambulatory Visit: Payer: Self-pay

## 2020-12-04 ENCOUNTER — Ambulatory Visit (INDEPENDENT_AMBULATORY_CARE_PROVIDER_SITE_OTHER): Payer: Self-pay | Admitting: Sports Medicine

## 2020-12-04 VITALS — BP 126/80 | Ht 63.5 in | Wt 235.0 lb

## 2020-12-04 DIAGNOSIS — M545 Low back pain, unspecified: Secondary | ICD-10-CM

## 2020-12-04 MED ORDER — DULOXETINE HCL 30 MG PO CPEP: 30.0000 mg | ORAL_CAPSULE | Freq: Every day | ORAL | 1 refills | Status: DC

## 2020-12-04 NOTE — Patient Instructions (Signed)
It was great to see you again! Thank you for letting me participate in your care!  Today, we discussed your continued pain and I would like for you to get some additional treatment such as dry needling, Aquatherapy, and use of TENs unit. In addition, I am starting you on Duloxetine for chronic musculoskeletal pain. Please take as directed and I will see you back in 4 weeks.  Be well, Jules Schick, DO PGY-4, Sports Medicine Fellow [facility]
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
## 2020-12-04 NOTE — Assessment & Plan Note (Signed)
Patient unfortunately has not responded well to treatments thus far. -We will [facility] to try alternative treatment such as dry needling, TENS unit, aqua therapy. -Additionally we will try her on duloxetine 30 mg once daily for the next 8 weeks to be stopped after 8 weeks whether she gets benefit or not. -I did see patient is on venlafaxine attempted to call to advise her to stop taking Venlafaxine or not start Duloxetine

## 2020-12-05 ENCOUNTER — Telehealth: Payer: Self-pay | Admitting: Family Medicine

## 2020-12-05 ENCOUNTER — Ambulatory Visit: Payer: Self-pay | Admitting: Physical Therapy

## 2020-12-05 NOTE — Telephone Encounter (Signed)
Called patient and spoke to her directly. She is currently not taking Effexor and has not been taking it for quite some time. I advised her to make sure she is not taking it and if so she is okay to take Duloxetine. Advised not to take Duloxetine and Venlafaxine in combination.  Jules Schick, DO [facility], PGY-4

## 2020-12-18 ENCOUNTER — Encounter: Payer: Self-pay | Admitting: Neurology

## 2020-12-18 ENCOUNTER — Ambulatory Visit (INDEPENDENT_AMBULATORY_CARE_PROVIDER_SITE_OTHER): Payer: Self-pay | Admitting: Neurology

## 2020-12-18 VITALS — BP 126/82 | HR 83 | Ht 63.0 in | Wt 245.0 lb

## 2020-12-18 DIAGNOSIS — Z87828 Personal history of other (healed) physical injury and trauma: Secondary | ICD-10-CM

## 2020-12-18 DIAGNOSIS — R519 Headache, unspecified: Secondary | ICD-10-CM

## 2020-12-18 NOTE — Patient Instructions (Signed)
Check into [facility] financial assistance for insurance options Try the cymbalta for 4-6 weeks to see if benefit to headaches If no help, will try Topamax  Continue the Maxalt for acute headache  Suggest getting a primary care doctor  See you back in 6 months
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
## 2021-01-01 ENCOUNTER — Ambulatory Visit: Payer: Medicaid Other | Admitting: Sports Medicine

## 2021-01-15 ENCOUNTER — Ambulatory Visit: Payer: Medicaid Other | Admitting: Sports Medicine

## 2021-01-24 ENCOUNTER — Ambulatory Visit (HOSPITAL_COMMUNITY): Payer: Medicaid Other | Admitting: Clinical

## 2021-02-08 ENCOUNTER — Other Ambulatory Visit: Payer: Self-pay

## 2021-02-08 ENCOUNTER — Ambulatory Visit (INDEPENDENT_AMBULATORY_CARE_PROVIDER_SITE_OTHER): Payer: Self-pay | Admitting: Sports Medicine

## 2021-02-08 DIAGNOSIS — M545 Low back pain, unspecified: Secondary | ICD-10-CM

## 2021-02-08 DIAGNOSIS — M25512 Pain in left shoulder: Secondary | ICD-10-CM

## 2021-02-08 DIAGNOSIS — G8929 Other chronic pain: Secondary | ICD-10-CM

## 2021-02-08 NOTE — Patient Instructions (Addendum)
It was a pleasure to see you today!  For your back and shoulder pain, we recommend you follow up with a physical medicine and rehab doctor, Dr. Lucie Leather, at [facility]. We will help set this appointment up for you. Continue your exercises in the meantime.   [facility] [address] [place], [place] [phone]  You will be contacted with this appt information.   You can wean off the duloxetine as follows: - Week 1: take duloxetine 30 mg every other day - Week 2: take duloxetine every third day STOP  Be Well,  Dr. Leary Roca

## 2021-02-08 NOTE — Progress Notes (Signed)
    SUBJECTIVE:   CHIEF COMPLAINT / HPI: f/u left shoulder and low back pain  30 yo woman presents for follow up of low back pain and left shoulder pain. Since her last visit, she has completed her second round of PT, this time with [facility]. She reports that prior to this most recent round of PT, her pain was 8-9/10. After having completed PT, her pain is a 7/10. For her back pain, she notes pain down the b/l paraspinous muscles, no radicular symptoms, no saddle anesthesia or incontinence. She has continued doing HEP given to her, but reports she could be doing it more. She does exercises 2-3 times per week. Her exercises include bridges, cat/cow stretches, and rowing exercises with hand weights.   Her left shoulder pain is diffuse and is a deep ache on the front of her shoulder along bicepital groove, as well as posterior shoulder and axilla. She also notes that she started having numbness and tingling with pain down her arm 1 month ago when she started taking duloxetine. She has no problem with gripping or lifting, but reports near constant pain. She does not think duloxetine has helped at all.  PERTINENT  PMH / PSH: low back pain   OBJECTIVE:   BP 138/78   Ht 5' 3.5" (1.613 m)   Wt 240 lb (108.9 kg)   BMI 41.85 kg/m   Nursing note and vitals reviewed GEN: ag-appropriate AAW, resting comfortably in chair, NAD, obese, alert and at baseline Left shoulder: No evidence of bony deformity, asymmetry, or muscle atrophy; + tenderness over long head of biceps (bicipital groove). + TTP at [facility] joint. Full active and passive range of motion (180 flex [name] /150Abd /90ER /70IR), Thumb to T12 without significant tenderness. Strength 5/5 throughout. No abnormal scapular function observed. Sensation intact. Peripheral pulses intact. DTRs intact. +Hawkins, +Empty can. Lumbar spine: Lumbar spine: - Inspection: no gross deformity or asymmetry, swelling or ecchymosis. No skin changes - Palpation: +  TTP on b/l paraspinal muscles from T-10 to S 1. No TTP over the spinous processes, or SI joints b/l - ROM: full active ROM of the lumbar spine in flexion and extension without pain - Strength: 5/5 strength of lower extremity in L4-S1 nerve root distributions b/l - Neuro: sensation intact in the L4-S1 nerve root distribution b/l, 2+ L4 and S1 reflexes - Straight Leg Raise test: NEG Ext: no edema Psych: Pleasant and appropriate   ASSESSMENT/PLAN:   Low back pain Patient with only modest improvement with second round of PT, no improvement with duloxetine. - Recommend patient see Dr. Lucie Leather, PMR at [facility], for assessment and she may be a candidate for further modalities from PM&R - Follow up as needed - Taper down from duloxetine: take 1 capsule every other day for first week, take 1 capsule every third day for second week, stop.  Left shoulder pain Patient with global shoulder pain and pan positive tenderness and exam findings. Suspect that she does not have rotator cuff impingement despite + Hawkins and + Empty can as her exam is pan positive, and she has intact ROM and strength of RC. Patient has given full PT x2 a try, recommend evaluation by PM&R, Dr. Lucie Leather. Follow up as needed.      Shirlean Mylar, MD [facility]

## 2021-02-08 NOTE — Assessment & Plan Note (Signed)
Patient with global shoulder pain and pan positive tenderness and exam findings. Suspect that she does not have rotator cuff impingement despite + Hawkins and + Empty can as her exam is pan positive, and she has intact ROM and strength of RC. Patient has given full PT x2 a try, recommend evaluation by PM&R, Dr. Lucie Leather. Follow up as needed.

## 2021-02-08 NOTE — Assessment & Plan Note (Signed)
Patient with only modest improvement with second round of PT, no improvement with duloxetine. - Recommend patient see Dr. Lucie Leather, PMR at [facility], for assessment and she may be a candidate for further modalities from PM&R - Follow up as needed - Taper down from duloxetine: take 1 capsule every other day for first week, take 1 capsule every third day for second week, stop.

## 2021-04-10 NOTE — Progress Notes (Signed)
Chart reviewed, agree above plan ?

## 2021-06-24 ENCOUNTER — Telehealth: Payer: Self-pay | Admitting: Neurology

## 2021-06-24 NOTE — Telephone Encounter (Signed)
Sent mychart message and LVM for patient to call back to reschedule because Maralyn Sago will be out.

## 2021-07-01 ENCOUNTER — Ambulatory Visit: Payer: Self-pay | Admitting: Neurology

## 2021-07-03 ENCOUNTER — Ambulatory Visit: Payer: Self-pay | Admitting: Neurology
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
## 2021-07-11 ENCOUNTER — Other Ambulatory Visit: Payer: Self-pay

## 2021-07-11 ENCOUNTER — Ambulatory Visit (INDEPENDENT_AMBULATORY_CARE_PROVIDER_SITE_OTHER): Payer: Self-pay | Admitting: Neurology

## 2021-07-11 ENCOUNTER — Encounter: Payer: Self-pay | Admitting: Neurology

## 2021-07-11 VITALS — BP 142/92 | HR 86 | Ht 63.0 in | Wt 246.0 lb

## 2021-07-11 DIAGNOSIS — R519 Headache, unspecified: Secondary | ICD-10-CM

## 2021-07-11 DIAGNOSIS — Z87828 Personal history of other (healed) physical injury and trauma: Secondary | ICD-10-CM

## 2021-07-11 MED ORDER — TIZANIDINE HCL 2 MG PO TABS: 2.0000 mg | ORAL_TABLET | Freq: Four times a day (QID) | ORAL | 0 refills | Status: DC | PRN

## 2021-07-11 MED ORDER — RIZATRIPTAN BENZOATE 10 MG PO TBDP: ORAL_TABLET | ORAL | 6 refills | Status: DC

## 2021-07-11 MED ORDER — TOPIRAMATE 25 MG PO TABS: ORAL_TABLET | ORAL | 3 refills | Status: DC

## 2021-07-11 NOTE — Patient Instructions (Signed)
Start taking Topamax 25 mg at bedtime x 3 night, then take 50 mg at bedtime x 3 nights, then take 75 mg at bedtime, stay at this dose   Add in Tizanidine as needed for acute headache, can combine with Maxalt for bad headache, even Aleve  See you back in 6 months, message on my chart about your progress

## 2021-07-12 ENCOUNTER — Telehealth: Payer: Self-pay | Admitting: Neurology

## 2021-07-12 MED ORDER — TOPIRAMATE 25 MG PO TABS: ORAL_TABLET | ORAL | 3 refills | Status: DC

## 2021-07-12 MED ORDER — RIZATRIPTAN BENZOATE 10 MG PO TBDP: ORAL_TABLET | ORAL | 6 refills | Status: DC

## 2021-07-12 MED ORDER — TIZANIDINE HCL 2 MG PO TABS: 2.0000 mg | ORAL_TABLET | Freq: Four times a day (QID) | ORAL | 0 refills | Status: DC | PRN

## 2021-07-12 NOTE — Telephone Encounter (Signed)
Patient called after-hours call service requesting that her 3 prescriptions from yesterday be sent to [facility] pharmacy.  I was able to talk to the patient to clarify.  She reported that [facility] pharmacy is too expensive.  She had already talked to [facility] but [facility] is not able to transfer prescription to [facility].  I will renew prescriptions for tizanidine, Maxalt and Topamax as prescribed on 07/11/2021 (by Margie Ege, NP) to be sent to [facility] pharmacy at patient's request.  She demonstrated understanding and appreciation.
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
## 2022-01-06 ENCOUNTER — Telehealth: Payer: Self-pay | Admitting: Neurology

## 2022-01-06 MED ORDER — TIZANIDINE HCL 2 MG PO TABS: 2.0000 mg | ORAL_TABLET | Freq: Four times a day (QID) | ORAL | 1 refills | Status: DC | PRN

## 2022-01-06 MED ORDER — RIZATRIPTAN BENZOATE 10 MG PO TBDP: ORAL_TABLET | ORAL | 1 refills | Status: DC

## 2022-01-06 NOTE — Telephone Encounter (Signed)
Spoke with patient and discussed the message from Sarah NP that it is ok to take Topamax QOD. Alternatively she can try 50 mg nightly and see if that is helpful. Could change meds later if needed. Pt stated she would try the 50 mg nightly for now and see how she does. She will report back with an update in 1-2 weeks. Her questions were answered and she verbalized appreciation for the call.

## 2022-01-06 NOTE — Telephone Encounter (Signed)
I called the pt to discuss the Topiramate. She states she is now up to the 3 tablets at bedtime dose. She stated that she has been taking it about every other night for two reasons: 1. She wasn't sure how fast she could get back in here for an appt (self pay) and 2. She noticed when taking it every night she started waking up with pressure/headache and when she takes it less often (never skipping more than 2 nights) she doesn't notice the pressure as much. She wasn't sure if stress had something to do with the symptoms. Pt aware this med is designed to take on a consistent basis and she should not be coming on and off of it. Will d/w Sarah NP and if instructions change will call her back. Pt verbalized appreciation. She will call when she is able to afford an appt.

## 2022-01-06 NOTE — Telephone Encounter (Signed)
I will refill Topamax, she can take every other day if she feels this is effective for her headache control, otherwise, she could try lower dose 50 mg nightly to see if more tolerable, or we may need to consider another medication.   Meds ordered this encounter  Medications   topiramate (TOPAMAX) 25 MG tablet    Sig: Take 3 tablets at bedtime    Dispense:  120 tablet    Refill:  5

## 2022-01-06 NOTE — Addendum Note (Signed)
Addended by: Gildardo Griffes on: 01/06/2022 04:29 PM   Modules accepted: Orders

## 2022-03-04 ENCOUNTER — Telehealth: Payer: Self-pay

## 2022-03-04 NOTE — Telephone Encounter (Signed)
Pa pending  Rizatriptan Benzoate 10MG  Key: [id number] - Rx #: 

## 2022-03-05 NOTE — Telephone Encounter (Addendum)
Per [facility], the patient paid cash for this prescription by using goodrx.com coupon. [employer] will sometimes pay for rizatriptan regular tablets vs orally disintegrating tablets. I spoke to her and she told me she is only paying about $18 out-of-pocket for the melts.  She wants to make another appt with our office to follow up on her migraines. She told me she has a balance due at our office. She would like a call from our billing department to see if she can set up a payment plan.

## 2022-03-27 ENCOUNTER — Telehealth: Payer: Self-pay | Admitting: Neurology
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
## 2022-04-05 ENCOUNTER — Other Ambulatory Visit: Payer: Self-pay | Admitting: Neurology

## 2022-04-07 ENCOUNTER — Other Ambulatory Visit: Payer: Self-pay | Admitting: Neurology

## 2022-05-31 ENCOUNTER — Other Ambulatory Visit: Payer: Self-pay | Admitting: Neurology

## 2022-06-24 ENCOUNTER — Ambulatory Visit: Payer: Medicaid Other | Admitting: Family Medicine

## 2022-06-26 ENCOUNTER — Telehealth: Payer: Self-pay | Admitting: Neurology

## 2022-06-26 NOTE — Telephone Encounter (Signed)
Pt having numbness right side of face, press in head. No longer taking the daily medication and only taken the rizatriptan (MAXALT-MLT) 10 MG disintegrating tablet .  Informed pt will be notified through MyChart of a sooner appt comes available.  Scheduled appt 09/09/22 at 1:30p

## 2022-07-01 NOTE — Telephone Encounter (Signed)
I called the pt to check in h/a are the same no improvements.   I have moved her f/u to 07/03/2022 with SS at 1015.  We will address at appt.
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
## 2022-07-03 ENCOUNTER — Ambulatory Visit: Payer: Medicaid Other | Admitting: Neurology

## 2022-07-03 VITALS — BP 135/82 | HR 92 | Ht 63.0 in | Wt 278.0 lb

## 2022-07-03 DIAGNOSIS — Z87828 Personal history of other (healed) physical injury and trauma: Secondary | ICD-10-CM | POA: Diagnosis not present

## 2022-07-03 DIAGNOSIS — R519 Headache, unspecified: Secondary | ICD-10-CM | POA: Diagnosis not present

## 2022-07-03 DIAGNOSIS — G43709 Chronic migraine without aura, not intractable, without status migrainosus: Secondary | ICD-10-CM

## 2022-07-03 MED ORDER — RIZATRIPTAN BENZOATE 10 MG PO TBDP: ORAL_TABLET | ORAL | 5 refills | Status: DC

## 2022-07-03 MED ORDER — TIZANIDINE HCL 2 MG PO TABS: 2.0000 mg | ORAL_TABLET | Freq: Four times a day (QID) | ORAL | 1 refills | Status: DC | PRN

## 2022-07-03 MED ORDER — FREMANEZUMAB-VFRM 225 MG/1.5ML ~~LOC~~ SOSY: 225.0000 mg | PREFILLED_SYRINGE | SUBCUTANEOUS | Status: DC

## 2022-07-03 NOTE — Patient Instructions (Signed)
Start Ajovy for migraine prevention  Continue the Maxalt Once you have new insurance I will send in rx to your pharmacy for the Ajovy, send me a message on my chart  See you back in 6 months

## 2022-07-17 ENCOUNTER — Telehealth: Payer: Self-pay

## 2022-07-17 NOTE — Telephone Encounter (Signed)
LMTCB. AS, CMA 

## 2022-08-20 ENCOUNTER — Telehealth: Payer: Self-pay | Admitting: Neurology

## 2022-08-20 NOTE — Telephone Encounter (Signed)
Pt is calling. Stated Judson Roch told her to call once she got her new insurance to see if Arie Sabina will be covered. [employer]  BH:[id number]  RX DZH:[id number] PCN: [id number] RXRP: [id number]

## 2022-08-20 NOTE — Telephone Encounter (Signed)
   I have looked at the 2024 formulary and it looks like ajovy is preferred

## 2022-08-21 MED ORDER — AJOVY 225 MG/1.5ML ~~LOC~~ SOAJ: 225.0000 mg | SUBCUTANEOUS | 11 refills | Status: DC

## 2022-09-01 ENCOUNTER — Telehealth: Payer: Self-pay | Admitting: Neurology

## 2022-09-01 NOTE — Telephone Encounter (Signed)
Pt states she just got off a call with pharmacy AF:[id number]-VBTY (AJOVY) 225 MG/1.5ML SOAJ , she was told the pharmacy isn't sure if there is a document that the pharmacy is needing the Dr to sign off on or if there is an issue  with insurance, please call.

## 2022-09-01 NOTE — Telephone Encounter (Signed)
  Judson Roch has written the prescription and I am not sure if it needs a PA, please advise her to reach back out to the pharmacy and let us know what we can do to help

## 2022-09-02 DIAGNOSIS — G43709 Chronic migraine without aura, not intractable, without status migrainosus: Secondary | ICD-10-CM | POA: Insufficient documentation

## 2022-09-02 NOTE — Telephone Encounter (Signed)
After checking pt's DPR a vm was left with the response from Belenda Cruise, [place] along with office #

## 2022-09-02 NOTE — Telephone Encounter (Signed)
Due to insurance we had to added the most recent office note for prior approval.  We are working as quickly as we can on this.

## 2022-09-02 NOTE — Telephone Encounter (Signed)
Pt returned phone call. Spoke with pharmacist yesterday have sent in the form for authorization for [facility] to sign. Pharmacist did not know what the hold up [facility] need to sign the authorization or the insurance. Would like a call from the nurse.

## 2022-09-02 NOTE — Telephone Encounter (Signed)
Pt is calling. Stated she wants to know what the hold-up on getting medication approved. Pt is requesting a call back from nurse

## 2022-09-02 NOTE — Telephone Encounter (Signed)
I reviewed the PA request for this med. To qualify for medication pt has to have a migraine type diagnosis. New Onset H/a is not a covered diagnosis for the pt's formulary. How would you like to proceed? Thanks

## 2022-09-03 NOTE — Telephone Encounter (Signed)
Pt asking if there is Ajovy at [facility] office can pick up so do not miss a dosage. Suppose to take medication tomorrow. Would like a call from the nurse.

## 2022-09-03 NOTE — Telephone Encounter (Signed)
We have samples she can pick up.  Please have her come by the office today or tomorrow, thanks.

## 2022-09-03 NOTE — Telephone Encounter (Signed)
Submitted a Prior Authorization request to [facility] for  Ajovy  via CoverMyMeds. Will update once we receive a response.  Key: [id number]

## 2022-09-04 NOTE — Telephone Encounter (Signed)
Sample placed and ready for pick up.

## 2022-09-04 NOTE — Telephone Encounter (Signed)
Called pt. She stated she will come by today and pick up sample. Stated she will also be calling insurance to check on PA.

## 2022-09-04 NOTE — Telephone Encounter (Signed)
Patient Advocate Encounter  Prior Authorization for AJOVY (fremanezumab-vfrm) injection 225MG /1.5ML auto-injectors has been approved.     Effective dates: 09/03/2022 through 11/25/2022      Lyndel Safe, [place] Patient Advocate Specialist [place] Patient Advocate Team Direct Number: [phone]  Fax: [phone]

## 2022-09-09 ENCOUNTER — Ambulatory Visit: Payer: BLUE CROSS/BLUE SHIELD | Admitting: Neurology

## 2022-10-08 ENCOUNTER — Other Ambulatory Visit: Payer: Self-pay

## 2022-10-08 ENCOUNTER — Telehealth: Payer: Self-pay | Admitting: Neurology

## 2022-10-08 MED ORDER — AJOVY 225 MG/1.5ML ~~LOC~~ SOAJ: 225.0000 mg | SUBCUTANEOUS | 11 refills | Status: DC

## 2022-10-08 NOTE — Telephone Encounter (Signed)
Pt has been waiting since Feb to get her  AJOVY (fremanezumab-vfrm) injection '225MG'$ /1.5ML auto-injectors .  She is aware of the approval   Effective dates: 09/03/2022 through 11/25/2022  Yet she is still unable to get the medication from [facility] [id number] .  Pt is asking that RN works to get clarity on what needs to be done so pt is able to soon get her Ajovy

## 2022-10-08 NOTE — Telephone Encounter (Signed)
Tried to call [employer]. They are closed for lunch until 2 pm.

## 2022-10-08 NOTE — Addendum Note (Signed)
Addended by: Gildardo Griffes on: 10/08/2022 01:42 PM   Modules accepted: Orders

## 2022-10-08 NOTE — Telephone Encounter (Signed)
Called pharmacy. Pt has [employer] medicaid. Looks like PA was done under [employer]. We will need to do a new PA.   BIN: [id number] PCN:  [place] ID: [id number] Group: [id number]

## 2022-10-08 NOTE — Telephone Encounter (Signed)
Pt

## 2022-10-09 ENCOUNTER — Other Ambulatory Visit: Payer: Self-pay | Admitting: *Deleted

## 2022-10-09 ENCOUNTER — Other Ambulatory Visit (INDEPENDENT_AMBULATORY_CARE_PROVIDER_SITE_OTHER): Payer: Self-pay

## 2022-10-09 DIAGNOSIS — Z79899 Other long term (current) drug therapy: Secondary | ICD-10-CM

## 2022-10-09 DIAGNOSIS — G43709 Chronic migraine without aura, not intractable, without status migrainosus: Secondary | ICD-10-CM

## 2022-10-09 DIAGNOSIS — Z0289 Encounter for other administrative examinations: Secondary | ICD-10-CM

## 2022-10-09 NOTE — Telephone Encounter (Signed)
Informed pt insurance has require pt to get a pregnancy test. Nurse has ask to come by the office today before 5 pm. Pt verbalized understand. Pt said will come by before 5 pm.

## 2022-10-09 NOTE — Telephone Encounter (Signed)
Pt has asked that it be noted that she called.  Pt is asking if there is a back up plan re: her getting this medication.  Pt states this coming Saturday will make it a week from the date she was supposed to have gotten this medication.  Pt was told that the nurse sent the request as High Priority and asked that it be worked with urgency. Pt asked that it be noted she intends on calling back to see if there are updates. Pt is aware the office is not open on Fridays.

## 2022-10-09 NOTE — Telephone Encounter (Signed)
I am working on the PA for [employer]. Pt's insurance requires that she have a pregnancy test completed (and must be negative) at baseline. Please tell the pt we have ordered it. Can she come here today before 5 to have it done?

## 2022-10-10 LAB — PREGNANCY, URINE: Preg Test, Ur: NEGATIVE

## 2022-10-13 ENCOUNTER — Encounter: Payer: Self-pay | Admitting: Family Medicine

## 2022-10-13 ENCOUNTER — Ambulatory Visit (INDEPENDENT_AMBULATORY_CARE_PROVIDER_SITE_OTHER): Payer: BLUE CROSS/BLUE SHIELD | Admitting: Family Medicine

## 2022-10-13 VITALS — BP 130/77 | HR 93 | Ht 63.0 in | Wt 270.0 lb

## 2022-10-13 DIAGNOSIS — G43709 Chronic migraine without aura, not intractable, without status migrainosus: Secondary | ICD-10-CM | POA: Diagnosis not present

## 2022-10-13 DIAGNOSIS — J302 Other seasonal allergic rhinitis: Secondary | ICD-10-CM | POA: Insufficient documentation

## 2022-10-13 DIAGNOSIS — F419 Anxiety disorder, unspecified: Secondary | ICD-10-CM

## 2022-10-13 DIAGNOSIS — F32A Depression, unspecified: Secondary | ICD-10-CM

## 2022-10-13 DIAGNOSIS — L810 Postinflammatory hyperpigmentation: Secondary | ICD-10-CM | POA: Insufficient documentation

## 2022-10-13 DIAGNOSIS — B351 Tinea unguium: Secondary | ICD-10-CM | POA: Diagnosis not present

## 2022-10-13 DIAGNOSIS — Z7689 Persons encountering health services in other specified circumstances: Secondary | ICD-10-CM

## 2022-10-13 DIAGNOSIS — M545 Low back pain, unspecified: Secondary | ICD-10-CM

## 2022-10-13 MED ORDER — FLUTICASONE PROPIONATE 50 MCG/ACT NA SUSP: 2.0000 | Freq: Every day | NASAL | 6 refills | Status: DC

## 2022-10-13 MED ORDER — EFINACONAZOLE 10 % EX SOLN: 1.0000 | Freq: Every day | CUTANEOUS | 2 refills | Status: DC

## 2022-10-13 MED ORDER — TRIAMCINOLONE ACETONIDE 0.1 % EX OINT: 1.0000 | TOPICAL_OINTMENT | Freq: Two times a day (BID) | CUTANEOUS | 0 refills | Status: AC

## 2022-10-13 MED ORDER — DICLOFENAC SODIUM 1 % EX GEL: 2.0000 g | Freq: Four times a day (QID) | CUTANEOUS | 1 refills | Status: AC

## 2022-10-13 NOTE — Assessment & Plan Note (Signed)
Efinaconizole solution given.  Advised to use daily for 48 weeks.

## 2022-10-13 NOTE — Patient Instructions (Addendum)
It was nice to meet you today.    Please follow up with [place] in 4 weeks or sooner if needed.    I have prescribed fluticasone spray to use for allergies/sinuses.    I have prescribed a topical pain relief cream for your back pain.  Use as directed.    A good resource to find therapists is the www.psychologytoday.com website and use their find a provider resource.    To get your covid shot you will need to go to your local pharmacy.    I have prescribed a topical steroid cream to apply to the area on the back of your leg.  Apply twice a day for 2 weeks.    We have ordered some labs for you.  We can discuss the results further at your next visit.    Have a great day,   Dr. Jeannine Kitten

## 2022-10-13 NOTE — Telephone Encounter (Signed)
Pregnancy test was negative. I have sent the PA to plan on CMM. Key: [id number]. Approved immediately.

## 2022-10-13 NOTE — Assessment & Plan Note (Signed)
No suicidal thoughts or plan.  Does not want to take medication.  Is open to therapy.  Given resources for list of therapists.

## 2022-10-13 NOTE — Assessment & Plan Note (Signed)
fluticasone
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
## 2022-10-13 NOTE — Assessment & Plan Note (Signed)
Managed by neurology.  Takes rizatriptan and tylenol for abortive treatment.   Beola Cord for preventive treatment.

## 2022-10-13 NOTE — Assessment & Plan Note (Signed)
Advised to continue with PM&R referral.   - tizanadine - voltaren gel started prn

## 2022-10-13 NOTE — Telephone Encounter (Signed)
Pt got results to pregnancy test around 3-4pm on Thursday of last week. Pt is waiting to be called re: a solution on when she will get her medication.

## 2022-10-14 ENCOUNTER — Other Ambulatory Visit: Payer: Self-pay | Admitting: Family Medicine

## 2022-10-14 LAB — CBC
Hematocrit: 36.8 % (ref 34.0–46.6)
Hemoglobin: 12.3 g/dL (ref 11.1–15.9)
MCH: 29.8 pg (ref 26.6–33.0)
MCHC: 33.4 g/dL (ref 31.5–35.7)
MCV: 89 fL (ref 79–97)
Platelets: 243 10*3/uL (ref 150–450)
RBC: 4.13 x10E6/uL (ref 3.77–5.28)
RDW: 13.6 % (ref 11.7–15.4)
WBC: 6.6 10*3/uL (ref 3.4–10.8)

## 2022-10-14 LAB — BASIC METABOLIC PANEL
BUN/Creatinine Ratio: 11 (ref 9–23)
BUN: 10 mg/dL (ref 6–20)
CO2: 22 mmol/L (ref 20–29)
Calcium: 9.4 mg/dL (ref 8.7–10.2)
Chloride: 106 mmol/L (ref 96–106)
Creatinine, Ser: 0.89 mg/dL (ref 0.57–1.00)
Glucose: 88 mg/dL (ref 70–99)
Potassium: 4.3 mmol/L (ref 3.5–5.2)
Sodium: 141 mmol/L (ref 134–144)
eGFR: 89 mL/min/{1.73_m2} (ref >59)

## 2022-10-14 LAB — LIPID PANEL
Chol/HDL Ratio: 4.3 ratio (ref 0.0–4.4)
Cholesterol, Total: 162 mg/dL (ref 100–199)
HDL: 38 mg/dL — ABNORMAL LOW (ref >39)
LDL Chol Calc (NIH): 107 mg/dL — ABNORMAL HIGH (ref 0–99)
Triglycerides: 93 mg/dL (ref 0–149)
VLDL Cholesterol Cal: 17 mg/dL (ref 5–40)

## 2022-10-14 LAB — HEMOGLOBIN A1C
Est. average glucose Bld gHb Est-mCnc: 128 mg/dL
Hgb A1c MFr Bld: 6.1 % — ABNORMAL HIGH (ref 4.8–5.6)

## 2022-10-14 MED ORDER — CICLOPIROX 8 % EX KIT: 1.0000 | PACK | Freq: Every day | CUTANEOUS | 2 refills | Status: DC

## 2022-10-14 NOTE — Progress Notes (Signed)
Previous medication was not covered by insurance.  Resending as ciclopirox

## 2022-10-15 ENCOUNTER — Telehealth: Payer: Self-pay

## 2022-10-15 NOTE — Telephone Encounter (Signed)
Pt called back and was advised of your result note stating cholesterol was slightly elevated and she was in the prediabetic range for her A1c.  Treatment for both is weight loss and healthy diet.  We can discuss this further at her next visit with me next month.     Pt agrees to try a healthier diet.   FYI

## 2022-10-17 DIAGNOSIS — Z3009 Encounter for other general counseling and advice on contraception: Secondary | ICD-10-CM | POA: Diagnosis not present

## 2022-10-17 DIAGNOSIS — Z01419 Encounter for gynecological examination (general) (routine) without abnormal findings: Secondary | ICD-10-CM | POA: Diagnosis not present

## 2022-10-17 DIAGNOSIS — Z3041 Encounter for surveillance of contraceptive pills: Secondary | ICD-10-CM | POA: Diagnosis not present

## 2022-10-17 DIAGNOSIS — Z113 Encounter for screening for infections with a predominantly sexual mode of transmission: Secondary | ICD-10-CM | POA: Diagnosis not present

## 2022-10-17 DIAGNOSIS — R87612 Low grade squamous intraepithelial lesion on cytologic smear of cervix (LGSIL): Secondary | ICD-10-CM | POA: Diagnosis not present

## 2022-10-17 DIAGNOSIS — Z114 Encounter for screening for human immunodeficiency virus [HIV]: Secondary | ICD-10-CM | POA: Diagnosis not present

## 2022-10-21 ENCOUNTER — Telehealth (HOSPITAL_BASED_OUTPATIENT_CLINIC_OR_DEPARTMENT_OTHER): Payer: Self-pay | Admitting: *Deleted

## 2022-10-21 NOTE — Telephone Encounter (Signed)
Contacted pt to be sure that she had received her results and she said yes and that she had not been able to get the medication due to a possible PA, informed pt that I would contact pharmacy and see what the problem was. Marjorie Deprey Zimmerman Rumple, CMA

## 2022-10-28 NOTE — Telephone Encounter (Signed)
Pt calling to get an update on what the pharmacy said when you called.

## 2022-10-28 NOTE — Telephone Encounter (Signed)
Contacted pt and let her know that the one medication is waiting on a PA per her pharmacy and the voltaren gel her pharmacy does not do it as a Rx, she can purchase over the counter.

## 2022-11-10 ENCOUNTER — Encounter: Payer: Self-pay | Admitting: Family Medicine

## 2022-11-10 ENCOUNTER — Ambulatory Visit: Payer: BLUE CROSS/BLUE SHIELD | Admitting: Family Medicine

## 2022-11-10 ENCOUNTER — Ambulatory Visit (INDEPENDENT_AMBULATORY_CARE_PROVIDER_SITE_OTHER): Payer: BLUE CROSS/BLUE SHIELD | Admitting: Family Medicine

## 2022-11-10 VITALS — BP 146/89 | HR 76 | Ht 63.0 in | Wt 274.0 lb

## 2022-11-10 DIAGNOSIS — R7303 Prediabetes: Secondary | ICD-10-CM | POA: Diagnosis not present

## 2022-11-10 DIAGNOSIS — F419 Anxiety disorder, unspecified: Secondary | ICD-10-CM | POA: Diagnosis not present

## 2022-11-10 DIAGNOSIS — B351 Tinea unguium: Secondary | ICD-10-CM

## 2022-11-10 DIAGNOSIS — F32A Depression, unspecified: Secondary | ICD-10-CM

## 2022-11-10 DIAGNOSIS — J302 Other seasonal allergic rhinitis: Secondary | ICD-10-CM | POA: Diagnosis not present

## 2022-11-10 MED ORDER — SALINE SPRAY 0.65 % NA SOLN: 1.0000 | NASAL | 3 refills | Status: AC | PRN

## 2022-11-10 MED ORDER — CICLOPIROX 8 % EX SOLN: Freq: Every day | CUTANEOUS | 5 refills | Status: AC

## 2022-11-10 MED ORDER — GUAIFENESIN ER 600 MG PO TB12: 600.0000 mg | ORAL_TABLET | Freq: Two times a day (BID) | ORAL | 2 refills | Status: AC | PRN

## 2022-11-10 NOTE — Assessment & Plan Note (Signed)
Patient again with positive GAD and PHQ screen.  Not interested in medications.  Has not reached out to a therapist since the last time but did get a list of therapists that are approved by her insurance.  I encouraged her to reach out to those therapists.  Patient affect is flat and mood is depressed.  Patient's depression appears to be affecting her motivation to fix the other issues we are working on such as diet/obesity and prediabetes.  Patient did not seem satisfied with any of the treatment options we provided for her various concerns today.

## 2022-11-10 NOTE — Progress Notes (Deleted)
Prediabetes  Weight  Anxiety/depression  onychomycosis

## 2022-11-10 NOTE — Progress Notes (Signed)
Established Patient Office Visit  Subjective   Patient ID: Terry [name], female    DOB: [birth year]  Age: 32 y.o. MRN: [id number]  Chief Complaint  Patient presents with   Follow-up   Rash   Sinusitis    Patient arrived approximately 20 minutes late at least to her 930 appointment.  Subsequently had to be moved to a 1050 appointment.  Depression-patient again screens positive on PHQ-9 and GAD, similar to her first appointment.  At that time she declined medication and was given a resource to locate local therapist that would accept her insurance.  Patient has not seen a therapist since that time.  She did reach out to her Medicaid provider for a list of therapist and has received it but has not done anything with it.  Obesity/prediabetes-discussed with patient her current diet.  She drinks multiple homemade smoothies throughout the day.  The smoothies consist of multiple different fruits/fruit juices including: Strawberries, watermelon, lemon juice, oranges, grape juice.  Will also have a solid, bagel with eggs for breakfast and usually a snack for dinner.  Says her main limiting factor for eating healthier is the cost of food.  We discussed limiting her juice intake.  Discussed portion control with things such as chips/dip.  Offered to send in a referral to to our nutritionist so that they may discuss with her healthy food options that are cost effective.  Also discussed the Mediterranean diet.  Patient declined the referral to a nutritionist.  Onychomycosis-patient states that her insurance will take the ciclopirox lacquer but not the kit that we originally prescribed.  Told her I would resend it as [employer] requested.  Congestion/allergies-patient complaining of increased nasal and chest congestion.  She takes Flonase daily.  Does not take antihistamines as they have not worked in the past.  We discussed Mucinex and how the same ingredient over-the-counter is also in prescription  and that there are no better prescription only mucolytic's available.  Also discussed as needed nasal saline spray to help with nasal congestion.  Patient did not seem satisfied with the treatment options available to her.  When asked what treatments have worked for her in the past she could not provide a better answer.       {History (Optional):23778}  ROS    Objective:     BP (!) 146/89   Pulse 76   Ht  (1.6 m)   Wt 274 lb (124.3 kg)   LMP 09/19/2022 (Exact Date)   SpO2 100% Comment: on RA  BMI 48.54 kg/m  {Vitals History (Optional):23777}  Physical Exam Constitutional:      Appearance: She is obese.  Cardiovascular:     Pulses: Normal pulses.     Heart sounds: Normal heart sounds.  Pulmonary:     Effort: Pulmonary effort is normal.     Breath sounds: Normal breath sounds.  Neurological:     General: No focal deficit present.     Mental Status: She is alert.  Psychiatric:     Comments: Depressed mood and flattened affect      No results found for any visits on 11/10/22.  {Labs (Optional):23779}  The ASCVD Risk score (Arnett DK, et al., 2019) failed to calculate for the following reasons:   The 2019 ASCVD risk score is only valid for ages 20 to 17    Assessment & Plan:   Problem List Items Addressed This Visit       Musculoskeletal and Integument   Onychomycosis  Patient's insurance has now declined 2 different prescriptions for for what I have provided for her onychomycosis.  Patient had a piece of paper with what exactly they would approve written down on it.  I sent in a prescription for what was written down.  Hopefully they approve it this time.      Relevant Medications   ciclopirox (PENLAC) 8 % solution     Other   Anxiety and depression - Primary    Patient again with positive GAD and PHQ screen.  Not interested in medications.  Has not reached out to a therapist since the last time but did get a list of therapists that are approved by  her insurance.  I encouraged her to reach out to those therapists.  Patient affect is flat and mood is depressed.  Patient's depression appears to be affecting her motivation to fix the other issues we are working on such as diet/obesity and prediabetes.  Patient did not seem satisfied with any of the treatment options we provided for her various concerns today.      Prediabetes    Patient may believe she is eating a healthier diet than she actually is.  Makes homemade smoothies but these are mostly fruit and fruit juice as well.  Has multiple of these per day.  Also eating bagels for breakfast and snacking on things such as chips and dip at dinner.  Discussed portion control and counting out serving sizes of snacks before eating them.  States that cost is a limiting factor in her ability to eat healthier.  Advised her that a nutritionist may help with finding low cost healthy options for food, but patient declined referral to a nutritionist.  Discussed diet options such as the Mediterranean diet and informed patient that there is a wealth of information regarding these on the Internet including websites and phone apps that can help her choose the healthier options.  Also provided information packet on discharge regarding Mediterranean diet.      Seasonal allergies    Patient complaining of nasal and chest congestion.  Discussed various options.  She is already on Flonase.  Does not want to try antihistamines because they have not worked in the past.  Talked about symptomatic relief with Mucinex and nasal saline.  Patient does not seem satisfied with her treatment options.  Sent in prescription for nasal saline as needed and guaifenesin twice daily as needed       Return in about 3 months (around 02/09/2023) for DM.    Sandre Kitty, MD

## 2022-11-10 NOTE — Patient Instructions (Addendum)
Please follow up with me in 3 months so we can recheck your blood sugar again.   I have resent the prescription for your foot lacquer as the insurance company requested it.    I have sent in prescriptions for mucinex and saline nasal spray to use for your allergy symptoms.    Please reach out to one of the therapists on the list provided by your insurance company.    Have a great day,   Dr. Constance Goltz

## 2022-11-10 NOTE — Assessment & Plan Note (Signed)
Patient may believe she is eating a healthier diet than she actually is.  Makes homemade smoothies but these are mostly fruit and fruit juice as well.  Has multiple of these per day.  Also eating bagels for breakfast and snacking on things such as chips and dip at dinner.  Discussed portion control and counting out serving sizes of snacks before eating them.  States that cost is a limiting factor in her ability to eat healthier.  Advised her that a nutritionist may help with finding low cost healthy options for food, but patient declined referral to a nutritionist.  Discussed diet options such as the Mediterranean diet and informed patient that there is a wealth of information regarding these on the Internet including websites and phone apps that can help her choose the healthier options.  Also provided information packet on discharge regarding Mediterranean diet.

## 2022-11-10 NOTE — Assessment & Plan Note (Signed)
Patient's insurance has now declined 2 different prescriptions for for what I have provided for her onychomycosis.  Patient had a piece of paper with what exactly they would approve written down on it.  I sent in a prescription for what was written down.  Hopefully they approve it this time.

## 2022-11-10 NOTE — Assessment & Plan Note (Signed)
Patient complaining of nasal and chest congestion.  Discussed various options.  She is already on Flonase.  Does not want to try antihistamines because they have not worked in the past.  Talked about symptomatic relief with Mucinex and nasal saline.  Patient does not seem satisfied with her treatment options.  Sent in prescription for nasal saline as needed and guaifenesin twice daily as needed

## 2022-11-13 ENCOUNTER — Encounter: Payer: Self-pay | Admitting: Family Medicine

## 2022-11-14 ENCOUNTER — Other Ambulatory Visit: Payer: Self-pay | Admitting: Family Medicine

## 2022-11-14 MED ORDER — METRONIDAZOLE 1 % EX GEL: Freq: Every day | CUTANEOUS | 1 refills | Status: AC

## 2022-11-14 NOTE — Progress Notes (Signed)
Pt called about perioral facial rash.  Patient then sent pictures of her rash.  Rash appears mild.  Also complained of lip swelling over the phone but this appeared to resolved at the time the pictures were sent.  No swelling was noted during her recent in person exam.    Appears to be perioral facial dermatitis possibly due to her mask usage.  Will send in prescription for metronidazole to use once daily for the next 14 weeks.  Patient will follow-up with me in between now and end of treatment duration.

## 2022-11-17 NOTE — Telephone Encounter (Signed)
I called pt to advise her that provider sent in Rx for abx ointment to apply to the rash and that she should start to see improvement in a few weeks. Suggested to use about 3 mo.

## 2022-12-02 ENCOUNTER — Telehealth: Payer: Self-pay | Admitting: Neurology

## 2022-12-02 NOTE — Telephone Encounter (Signed)
Pt stated she needs a PA for rizatriptan (MAXALT-MLT) 10 MG disintegrating tablet.

## 2022-12-03 ENCOUNTER — Other Ambulatory Visit (HOSPITAL_COMMUNITY): Payer: Self-pay

## 2022-12-03 ENCOUNTER — Telehealth: Payer: Self-pay

## 2022-12-03 NOTE — Telephone Encounter (Signed)
PA has been initiated.

## 2022-12-03 NOTE — Telephone Encounter (Signed)
Pt is stating the the Pharmacy now says she needs a PA on fluticasone (FLONASE) 50 MCG/ACT nasal spray .  Please advise

## 2022-12-03 NOTE — Telephone Encounter (Signed)
Pharmacy Patient Advocate Encounter   Received notification from [employer] that prior authorization for Rizatriptan Benzoate 10MG  dispersible tablets is required/requested.   PA submitted on 12/03/2022 to (ins) [employer] via [employer] or (Medicaid) confirmation # [id number] Status is pending

## 2022-12-03 NOTE — Telephone Encounter (Signed)
A new telephone call encounter has been made for this PA Request-please see telephone note dated 12/03/2022. 

## 2022-12-15 ENCOUNTER — Other Ambulatory Visit (HOSPITAL_COMMUNITY): Payer: Self-pay

## 2022-12-15 ENCOUNTER — Telehealth: Payer: Self-pay

## 2022-12-15 NOTE — Telephone Encounter (Signed)
    Please advise-Per chart it dosen't look like the PT has been followed up on this med since previous approval-basically I do not have the needed documentation to try to get an approval.

## 2022-12-15 NOTE — Telephone Encounter (Signed)
   Received a notice that a PA is needed via CMM-This will be for the renewal-PT has not been seen since 06/2022-Please advise-

## 2022-12-15 NOTE — Telephone Encounter (Signed)
This would be considered renewal as they are in the time frame still and their next appt is 02/04/23.

## 2022-12-15 NOTE — Telephone Encounter (Signed)
PT is no longer covered by [employer]-will resubmit PA to [employer] [place] Medicaid.  Pharmacy Patient Advocate Encounter   Received notification from [employer] that prior authorization for Rizatriptan Benzoate 10MG  dispersible tablets is required/requested.   PA submitted on 12/15/2022 to (ins) [employer] [facility] via [employer] or ([place]) confirmation # [id number] Status is pending

## 2022-12-15 NOTE — Telephone Encounter (Signed)
Pharmacy Patient Advocate Encounter  Prior Authorization for Rizatriptan Benzoate 10MG  dispersible tablets has been approved by [employer] (ins).    PA # PA Case ID #: [id number] Effective dates: 12/15/2022 through 12/14/2023

## 2022-12-16 ENCOUNTER — Other Ambulatory Visit: Payer: Self-pay | Admitting: Family Medicine

## 2022-12-16 NOTE — Telephone Encounter (Signed)
Pharmacy Patient Advocate Encounter   Received notification from [facility] that prior authorization for AJOVY (fremanezumab-vfrm) injection 225MG /1.5ML auto-injectors is required/requested.   PA submitted on 12/16/2022 to (ins) [employer] [facility]  via [employer] or ([place]) confirmation # [id number]  Status is pending

## 2022-12-16 NOTE — Telephone Encounter (Signed)
PA has been approved from 12/16/2022-12/16/2023

## 2023-01-15 ENCOUNTER — Other Ambulatory Visit: Payer: Self-pay | Admitting: Family Medicine

## 2023-01-15 MED ORDER — ALBUTEROL SULFATE HFA 108 (90 BASE) MCG/ACT IN AERS: 2.0000 | INHALATION_SPRAY | RESPIRATORY_TRACT | 11 refills | Status: AC | PRN

## 2023-01-19 ENCOUNTER — Other Ambulatory Visit: Payer: Self-pay | Admitting: Neurology

## 2023-02-04 ENCOUNTER — Ambulatory Visit: Payer: Medicaid Other | Admitting: Neurology

## 2023-02-04 VITALS — BP 132/78 | HR 93 | Ht 63.0 in | Wt 266.0 lb

## 2023-02-04 DIAGNOSIS — G43709 Chronic migraine without aura, not intractable, without status migrainosus: Secondary | ICD-10-CM | POA: Diagnosis not present

## 2023-02-04 MED ORDER — RIZATRIPTAN BENZOATE 10 MG PO TABS: 10.0000 mg | ORAL_TABLET | ORAL | 11 refills | Status: AC | PRN

## 2023-02-04 MED ORDER — AJOVY 225 MG/1.5ML ~~LOC~~ SOAJ: 225.0000 mg | SUBCUTANEOUS | 11 refills | Status: AC

## 2023-02-04 MED ORDER — TIZANIDINE HCL 2 MG PO TABS: 2.0000 mg | ORAL_TABLET | Freq: Four times a day (QID) | ORAL | 1 refills | Status: AC | PRN

## 2023-02-04 MED ORDER — ONDANSETRON 4 MG PO TBDP: 4.0000 mg | ORAL_TABLET | Freq: Three times a day (TID) | ORAL | 3 refills | Status: AC | PRN
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
## 2023-02-04 NOTE — Patient Instructions (Signed)
Great to see you today!  Wish you the best of luck with the upcoming dance tryouts! Continue current medications, message me if headaches increase, otherwise we will see you in 1 year :) Thanks!!

## 2023-02-16 ENCOUNTER — Ambulatory Visit: Payer: Medicaid Other | Admitting: Family Medicine

## 2023-02-16 ENCOUNTER — Encounter: Payer: Self-pay | Admitting: Family Medicine

## 2023-02-16 VITALS — BP 132/83 | HR 80 | Ht 63.0 in | Wt 270.8 lb

## 2023-02-16 DIAGNOSIS — F419 Anxiety disorder, unspecified: Secondary | ICD-10-CM

## 2023-02-16 DIAGNOSIS — R053 Chronic cough: Secondary | ICD-10-CM

## 2023-02-16 DIAGNOSIS — R7303 Prediabetes: Secondary | ICD-10-CM | POA: Diagnosis not present

## 2023-02-16 DIAGNOSIS — F32A Depression, unspecified: Secondary | ICD-10-CM

## 2023-02-16 DIAGNOSIS — K219 Gastro-esophageal reflux disease without esophagitis: Secondary | ICD-10-CM | POA: Insufficient documentation

## 2023-02-16 LAB — POCT GLYCOSYLATED HEMOGLOBIN (HGB A1C): HbA1c POC (<> result, manual entry): 6 % (ref 4.0–5.6)

## 2023-02-16 MED ORDER — DEXLANSOPRAZOLE 30 MG PO CPDR: 30.0000 mg | DELAYED_RELEASE_CAPSULE | Freq: Every day | ORAL | 1 refills | Status: DC

## 2023-02-16 NOTE — Patient Instructions (Signed)
It was nice to see you today,  We addressed the following topics today: -I have prescribed dexilant. You should take this once a day at night before bedtime for at least 2 weeks. - If this does not help your cough, I would like you to switch to taking Zyrtec, Allegra, or Claritin over-the-counter once a day for 2 weeks. - If neither of these help your cough, let us know 1 months from now and we will send in a referral to an ear nose and throat doctor - I will order a chest x-ray in the meantime.  You can get this done at [facility] imaging.  Typically you can walk in at any time during business hours and have this done.    Have a great day,  Frederic Jericho, MD

## 2023-02-16 NOTE — Progress Notes (Unsigned)
Established Patient Office Visit  Subjective   Patient ID: Terry [name], female    DOB: 04/10/1991  Age: 32 y.o. MRN: [id number]  Chief Complaint  Patient presents with   Medical Management of Chronic Issues    HPI  Depression -patient has not seen a therapist since her last visit.  She states that she is going to go through better help this week to check pricing and compared to the [facility] list of providers.  Feels like her feelings of depression are "about the same" since last time.  Chronic cough/congestion-patient is taking over-the-counter equate nondrowsy cold and flu medicine.  She also takes Flonase.  She says that she is still experiences productive cough with yellow sputum.  Notices that it is worse if she does not take the cough medication for a few days.  We discussed causes of chronic cough.  She has a history of acid reflux and was on dexlansoprazole after failing several other PPIs in the past but has not taken this in a few years.  She states that her cough is worse in the morning, gets better around noon, and then worse again in the evening  Rash on face-states that this is better.  Noticed that it was getting worse when she was out in the sun more often.  Use her metronidazole gel 1 time before stopping.  Patient asked about ryze mushroom coffee and if this was safe to take.  Discussed that these mushrooms in general are safe.  Advised her that the efficacy may not be known until she tries it, but that it is safe to try.   The ASCVD Risk score (Arnett DK, et al., 2019) failed to calculate for the following reasons:   The 2019 ASCVD risk score is only valid for ages 23 to 23   {History (Optional):23778}  ROS    Objective:     BP 132/83   Pulse 80   Ht 5\' 3"  (1.6 m)   Wt 270 lb 12.8 oz (122.8 kg)   SpO2 97%   BMI 47.97 kg/m  {Vitals History (Optional):23777}  Physical Exam General: Alert, oriented Pulmonary: No respiratory distress, no cough Psych: Blunted  affect, although patient overall appears in a better mood than previous visits   Results for orders placed or performed in visit on 02/16/23  POCT HgB A1C  Result Value Ref Range   Hemoglobin A1C     HbA1c POC (<> result, manual entry) 6.0 4.0 - 5.6 %   HbA1c, POC (prediabetic range)     HbA1c, POC (controlled diabetic range)      {Labs (Optional):23779}      Assessment & Plan:   Prediabetes Assessment & Plan: A1c rechecked.  Similar to previous value.  Continue to encourage diet and exercise.  Orders: -     POCT glycosylated hemoglobin (Hb A1C)  Chronic cough Assessment & Plan: Patient complains of chronic "chest congestion" for the past 2 years. - Chest x-ray ordered - Advised to take nightly PPI for the next 2 weeks.  She has failed multiple over-the-counter PPIs in the past including pantoprazole, omeprazole, esomprazole, and h2 blocker famotidine and has been prescribed Dexilant. - If this does not improve symptoms, advised to use over-the-counter antihistamines for 2 weeks every day - If in 1 month neither of these have improved her cough will refer to ear nose and throat    Anxiety and depression Assessment & Plan: Feels like symptoms are the same from last visit.  Has not  reached out to therapist yet.  Encouraged patient to speak with therapist.   Other orders -     Dexlansoprazole; Take 1 capsule (30 mg total) by mouth daily.  Dispense: 30 capsule; Refill: 1     Return in about 3 months (around 05/19/2023) for Cough, prediabetes,.    Sandre Kitty, MD

## 2023-02-17 DIAGNOSIS — R053 Chronic cough: Secondary | ICD-10-CM | POA: Insufficient documentation

## 2023-02-17 NOTE — Assessment & Plan Note (Signed)
A1c rechecked.  Similar to previous value.  Continue to encourage diet and exercise.

## 2023-02-17 NOTE — Assessment & Plan Note (Signed)
Feels like symptoms are the same from last visit.  Has not reached out to therapist yet.  Encouraged patient to speak with therapist.

## 2023-02-17 NOTE — Assessment & Plan Note (Addendum)
Patient complains of chronic "chest congestion" for the past 2 years. - Chest x-ray ordered - Advised to take nightly PPI for the next 2 weeks.  She has failed multiple over-the-counter PPIs in the past including pantoprazole, omeprazole, esomprazole, and h2 blocker famotidine and has been prescribed Dexilant. - If this does not improve symptoms, advised to use over-the-counter antihistamines for 2 weeks every day - If in 1 month neither of these have improved her cough will refer to ear nose and throat

## 2023-02-19 ENCOUNTER — Telehealth: Payer: Self-pay

## 2023-02-19 NOTE — Telephone Encounter (Signed)
Paperwork has been faxed 02/19/2023

## 2023-02-19 NOTE — Telephone Encounter (Signed)
Pt called stating that she hadn't heard from Pharmacy. I reached out to pharmacy and was advised that a PA is needed for Dexlansoprazole (DEXILANT) 30 MG capsule DR .   Pt reached out to insurance and was given a PA number [id number] and phone number [phone] and Fax number [phone]

## 2023-03-02 ENCOUNTER — Other Ambulatory Visit: Payer: Self-pay | Admitting: Family Medicine

## 2023-03-02 MED ORDER — DEXLANSOPRAZOLE 30 MG PO CPDR: 30.0000 mg | DELAYED_RELEASE_CAPSULE | Freq: Every day | ORAL | 3 refills | Status: DC

## 2023-03-02 MED ORDER — DEXLANSOPRAZOLE 30 MG PO CPDR: 30.0000 mg | DELAYED_RELEASE_CAPSULE | Freq: Every day | ORAL | 2 refills | Status: AC

## 2023-03-02 NOTE — Progress Notes (Signed)
Resending as dexilant brand name instead of delayed release dexlansoprazole per her insurance's request.  Please let pt know this should be ready soon at her pharmacy

## 2023-03-09 ENCOUNTER — Encounter: Payer: Self-pay | Admitting: Family Medicine

## 2023-04-16 DIAGNOSIS — F330 Major depressive disorder, recurrent, mild: Secondary | ICD-10-CM | POA: Diagnosis not present

## 2023-04-29 DIAGNOSIS — F330 Major depressive disorder, recurrent, mild: Secondary | ICD-10-CM | POA: Diagnosis not present

## 2023-05-06 DIAGNOSIS — F330 Major depressive disorder, recurrent, mild: Secondary | ICD-10-CM | POA: Diagnosis not present

## 2023-05-15 DIAGNOSIS — F330 Major depressive disorder, recurrent, mild: Secondary | ICD-10-CM | POA: Diagnosis not present

## 2023-05-20 ENCOUNTER — Other Ambulatory Visit: Payer: Self-pay | Admitting: Family Medicine

## 2023-05-20 ENCOUNTER — Ambulatory Visit: Payer: Medicaid Other | Admitting: Family Medicine

## 2023-05-21 ENCOUNTER — Other Ambulatory Visit (HOSPITAL_COMMUNITY): Payer: Self-pay

## 2023-06-05 ENCOUNTER — Ambulatory Visit: Payer: Medicaid Other | Admitting: Family Medicine

## 2023-06-14 ENCOUNTER — Other Ambulatory Visit: Payer: Self-pay | Admitting: Family Medicine

## 2023-06-22 ENCOUNTER — Ambulatory Visit: Payer: Medicaid Other | Admitting: Family Medicine

## 2023-06-22 NOTE — Progress Notes (Deleted)
   Established Patient Office Visit  Subjective   Patient ID: Terry [name], female    DOB: 1991/03/04  Age: 32 y.o. MRN: [id number]  No chief complaint on file.   HPI  Chronic cough - ppi - dexilant? Xray?   Depression - has she spoken to therapist?   Obesity -   The ASCVD Risk score (Arnett DK, et al., 2019) failed to calculate for the following reasons:   The 2019 ASCVD risk score is only valid for ages 10 to 80  Health Maintenance Due  Topic Date Due   COVID-19 Vaccine (1) Never done   Hepatitis C Screening  Never done   DTaP/Tdap/Td (1 - Tdap) Never done   Cervical Cancer Screening (HPV/Pap Cotest)  Never done   INFLUENZA VACCINE  Never done      Objective:     There were no vitals taken for this visit. {Vitals History (Optional):23777}  Physical Exam   No results found for any visits on 06/22/23.      Assessment & Plan:   There are no diagnoses linked to this encounter.   No follow-ups on file.    Sandre Kitty, MD

## 2023-07-18 ENCOUNTER — Other Ambulatory Visit: Payer: Self-pay | Admitting: Family Medicine

## 2023-09-15 ENCOUNTER — Other Ambulatory Visit: Payer: Self-pay | Admitting: Family Medicine

## 2023-11-20 ENCOUNTER — Telehealth: Payer: Self-pay

## 2023-11-20 ENCOUNTER — Other Ambulatory Visit (HOSPITAL_COMMUNITY): Payer: Self-pay

## 2023-11-20 NOTE — Telephone Encounter (Signed)
 Pharmacy Patient Advocate Encounter  Received notification from [facility] that Prior Authorization for AJOVY  (fremanezumab -vfrm) injection 225MG /1.5ML auto-injectors has been APPROVED from 11/20/2023 to 11/19/2024. Ran test claim, Copay is $4.00. This test claim was processed through [facility]- copay amounts may vary at other pharmacies due to pharmacy/plan contracts, or as the patient moves through the different stages of their insurance plan.   PA #/Case ID/Reference #: PA Case ID #: [id number]

## 2023-11-20 NOTE — Telephone Encounter (Signed)
 Pharmacy Patient Advocate Encounter   Received notification from [employer] that prior authorization for AJOVY  (fremanezumab -vfrm) injection 225MG /1.5ML auto-injectors is required/requested.   Insurance verification completed.   The patient is insured through [facility] .   Per test claim: PA required; PA submitted to above mentioned insurance via CoverMyMeds Key/confirmation #/EOC [id number] Status is pending

## 2024-02-04 ENCOUNTER — Ambulatory Visit: Payer: Medicaid Other | Admitting: Neurology

## 2024-02-10 ENCOUNTER — Ambulatory Visit: Admitting: Neurology
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
## 2024-02-16 ENCOUNTER — Telehealth: Payer: Self-pay | Admitting: *Deleted

## 2024-02-16 NOTE — Telephone Encounter (Signed)
 LVM for pt to call office to see about scheduling an appointment for a checkup/annual, with labs the week before.   Last appt was 02/16/23. Please assist in getting this scheduled at the patients earliest convenience.

## 2024-03-31 NOTE — Telephone Encounter (Signed)
 2nd attempt to get an appt scheduled.  Last seen 01/2023. If pt calls back please assist in getting her an appointment for her annual check up and labs the week before. I will also send a mychart message.

## 2024-05-03 ENCOUNTER — Telehealth: Payer: Self-pay | Admitting: *Deleted

## 2024-05-03 NOTE — Telephone Encounter (Signed)
 Tried to contact pt to schedule an appointment and I was unable to LVM due to it being full. Will send a Mychart message and request patient to call and schedule an appointment.

## 2024-05-10 ENCOUNTER — Ambulatory Visit: Admitting: Neurology

## 2024-05-10 ENCOUNTER — Encounter: Payer: Self-pay | Admitting: Neurology
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
## 2024-05-11 ENCOUNTER — Telehealth: Payer: Self-pay

## 2024-05-11 ENCOUNTER — Encounter: Payer: Self-pay | Admitting: Neurology

## 2024-05-11 ENCOUNTER — Ambulatory Visit: Admitting: Neurology

## 2024-05-11 NOTE — Telephone Encounter (Signed)
 Patient called in today at 8:40 to reschedule her 8:45 appointment. She had left a vm yesterday at 10:00 to reschedule her 9:15 appointment stating she had overslept. Upon review, this is her 4th no show or cancellation within 24 hours since 01/2024. I spoke with Lauraine NP to confirm it was ok to reschedule ad she would like the patient to be dismissed per no show policy. Patient was advised that I would not be able to reschedule the appointment per our no show policy. She is asking for a call back once a decision has been made
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
